# Patient Record
Sex: Male | Born: 1945
Health system: Southern US, Community
[De-identification: ages and names within clinical notes are randomized; demographics above are authoritative.]

## PROBLEM LIST (undated history)

## (undated) ENCOUNTER — Ambulatory Visit

## (undated) ENCOUNTER — Telehealth: Attending: Radiation Oncology | Primary: Radiation Oncology

## (undated) ENCOUNTER — Encounter: Attending: Radiation Oncology | Primary: Radiation Oncology

## (undated) ENCOUNTER — Ambulatory Visit: Payer: PRIVATE HEALTH INSURANCE

## (undated) ENCOUNTER — Telehealth

## (undated) ENCOUNTER — Encounter
Attending: Student in an Organized Health Care Education/Training Program | Primary: Student in an Organized Health Care Education/Training Program

## (undated) ENCOUNTER — Encounter

## (undated) ENCOUNTER — Ambulatory Visit: Attending: Radiation Oncology | Primary: Radiation Oncology

## (undated) ENCOUNTER — Encounter: Attending: Urology | Primary: Urology

## (undated) ENCOUNTER — Telehealth
Attending: Student in an Organized Health Care Education/Training Program | Primary: Student in an Organized Health Care Education/Training Program

## (undated) ENCOUNTER — Ambulatory Visit: Payer: PRIVATE HEALTH INSURANCE | Attending: Radiation Oncology | Primary: Radiation Oncology

## (undated) ENCOUNTER — Encounter: Attending: Oncology | Primary: Oncology

## (undated) DIAGNOSIS — G43909 Migraine, unspecified, not intractable, without status migrainosus: Secondary | ICD-10-CM

## (undated) DIAGNOSIS — M5127 Other intervertebral disc displacement, lumbosacral region: Secondary | ICD-10-CM

## (undated) DIAGNOSIS — G629 Polyneuropathy, unspecified: Secondary | ICD-10-CM

## (undated) DIAGNOSIS — K219 Gastro-esophageal reflux disease without esophagitis: Secondary | ICD-10-CM

## (undated) DIAGNOSIS — R002 Palpitations: Secondary | ICD-10-CM

## (undated) DIAGNOSIS — K2270 Barrett's esophagus without dysplasia: Secondary | ICD-10-CM

## (undated) DIAGNOSIS — K449 Diaphragmatic hernia without obstruction or gangrene: Secondary | ICD-10-CM

## (undated) DIAGNOSIS — I499 Cardiac arrhythmia, unspecified: Secondary | ICD-10-CM

## (undated) DIAGNOSIS — F419 Anxiety disorder, unspecified: Secondary | ICD-10-CM

## (undated) DIAGNOSIS — I8390 Asymptomatic varicose veins of unspecified lower extremity: Secondary | ICD-10-CM

## (undated) DIAGNOSIS — I10 Essential (primary) hypertension: Secondary | ICD-10-CM

## (undated) DIAGNOSIS — D126 Benign neoplasm of colon, unspecified: Secondary | ICD-10-CM

## (undated) HISTORY — DX: Gastro-esophageal reflux disease without esophagitis: K21.9

## (undated) HISTORY — PX: TONSILLECTOMY AND ADENOIDECTOMY: SUR1326

## (undated) HISTORY — DX: Migraine, unspecified, not intractable, without status migrainosus: G43.909

## (undated) HISTORY — PX: PROSTATE BIOPSY: SHX241

## (undated) HISTORY — DX: Palpitations: R00.2

---
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
## 2003-11-28 ENCOUNTER — Other Ambulatory Visit: Payer: Self-pay

## 2005-02-21 ENCOUNTER — Ambulatory Visit: Payer: Self-pay | Admitting: Unknown Physician Specialty

## 2005-03-06 ENCOUNTER — Ambulatory Visit: Payer: Self-pay | Admitting: Unknown Physician Specialty

## 2006-06-01 DIAGNOSIS — I10 Essential (primary) hypertension: Secondary | ICD-10-CM | POA: Insufficient documentation

## 2006-06-01 DIAGNOSIS — K449 Diaphragmatic hernia without obstruction or gangrene: Secondary | ICD-10-CM | POA: Insufficient documentation

## 2006-06-01 DIAGNOSIS — K2100 Gastro-esophageal reflux disease with esophagitis, without bleeding: Secondary | ICD-10-CM | POA: Insufficient documentation

## 2006-12-04 HISTORY — PX: RETINAL DETACHMENT SURGERY: SHX105

## 2007-08-20 ENCOUNTER — Ambulatory Visit: Payer: Self-pay | Admitting: Unknown Physician Specialty

## 2008-06-16 DIAGNOSIS — K2270 Barrett's esophagus without dysplasia: Secondary | ICD-10-CM | POA: Insufficient documentation

## 2009-01-06 ENCOUNTER — Encounter: Payer: Self-pay | Admitting: Cardiology

## 2009-01-07 ENCOUNTER — Ambulatory Visit: Payer: Self-pay | Admitting: Cardiology

## 2009-01-12 ENCOUNTER — Ambulatory Visit: Payer: Self-pay | Admitting: Cardiology

## 2009-01-14 ENCOUNTER — Encounter: Payer: Self-pay | Admitting: Cardiology

## 2009-01-14 ENCOUNTER — Ambulatory Visit: Payer: Self-pay

## 2009-01-21 ENCOUNTER — Ambulatory Visit: Payer: Self-pay | Admitting: Cardiology

## 2010-05-04 ENCOUNTER — Ambulatory Visit: Payer: Self-pay | Admitting: Unknown Physician Specialty

## 2010-10-26 ENCOUNTER — Ambulatory Visit: Payer: Self-pay | Admitting: Cardiovascular Disease

## 2010-10-26 DIAGNOSIS — R002 Palpitations: Secondary | ICD-10-CM

## 2010-10-26 DIAGNOSIS — I10 Essential (primary) hypertension: Secondary | ICD-10-CM

## 2010-10-28 ENCOUNTER — Encounter: Payer: Self-pay | Admitting: Cardiovascular Disease

## 2011-01-03 NOTE — Assessment & Plan Note (Signed)
Summary: EC6/AMD  Medications Added PRILOSEC 20 MG CPDR (OMEPRAZOLE) 1 tablet two times a day * FISH OIL 1000MG  1 tablet two times a day CALTRATE 600 1500 MG TABS (CALCIUM CARBONATE) 1 tabet once daily CITRATE OF MAGNESIA  SOLN (MAGNESIUM CITRATE) once daily      Allergies Added: ! ASPIRIN  Visit Type:  Initial Consult Primary Provider:  Dortha Kern, PA-C  CC:  establish care. Denies chest pain and SOB. c/o occasional palpitations..  History of Present Illness: 65 year old with a h/o  palpitations. Previous h/o HTN  He continues to have a lot of stress at work.   A Holter monitor in 2010 showed frequent premature ventricular contractions.  There were no other significant arrhythmias noted on the Holter monitor.    He was previously started on low dose lisinopril (currently cuts it into a 1/4) with SBP in the low 100s. He is not taking metoprolol for palpitations. He swims frequently with no symptoms.   echocardiogram done in February 2007.   LV systolic function was normal with an EF of 55%.  There was no LVH.  The RV appeared normal.  The septal wall showed abnormal motion  EKG: NSR with rate of 73 bpm, no significant ST or T wave changes  Current Medications (verified): 1)  Lisinopril 5 Mg Tabs (Lisinopril) .... Take One Tablet By Mouth Daily 2)  Prilosec 20 Mg Cpdr (Omeprazole) .Marland Kitchen.. 1 Tablet Two Times A Day 3)  Fish Oil 1000mg  .... 1 Tablet Two Times A Day 4)  Caltrate 600 1500 Mg Tabs (Calcium Carbonate) .Marland Kitchen.. 1 Tabet Once Daily 5)  Citrate of Magnesia  Soln (Magnesium Citrate) .... Once Daily  Allergies (verified): 1)  ! Aspirin  Past History:  Past Medical History: Last updated: 11/08/2009 1. Gastroesophageal reflux disease 2. History of palpitations identified as PVC's. We did do a holter monitor in February 2010 showing frequent PVC's. Additionally, the patient had an echocardiogram done in February 2010. EF 55%. There was some abnormal septal motion that  may have been due to frequent PVC's. There was no LVH. There was no significant valvular disorder and the RV was normal in size and function.  Family History: Last updated: 11/08/2009 There is no family history of premature coronary disease  Social History: Last updated: 10/26/2010 Married Lives in [place] with his wife Works for [employer]  smoke-no Alcohol Use - no  Social History: Married Lives in [place] with his wife Works for [employer]  smoke-no Alcohol Use - no  Review of Systems  The patient denies fever, weight loss, weight gain, vision loss, decreased hearing, hoarseness, chest pain, syncope, dyspnea on exertion, peripheral edema, prolonged cough, abdominal pain, incontinence, muscle weakness, depression, and enlarged lymph nodes.         rare palpitations  Vital Signs:  Patient profile:   65 year old male Height:      71 inches Weight:      173.75 pounds BMI:     24.32 Pulse rate:   73 / minute BP sitting:   162 / 92  (left arm) Cuff size:   regular  Vitals Entered By: Bishop Dublin, CMA (October 26, 2010 2:28 PM)  Physical Exam  General:  Well developed, well nourished, in no acute distress. Head:  normocephalic and atraumatic Neck:  Neck supple, no JVD. No masses, thyromegaly or abnormal cervical nodes. Lungs:  Clear bilaterally to auscultation and percussion. Heart:  Non-displaced PMI, chest non-tender; regular rate and rhythm, S1, S2 without murmurs,  rubs or gallops. Carotid upstroke normal, no bruit.  Pedals normal pulses. No edema, no varicosities. Abdomen:  Bowel sounds positive; abdomen soft and non-tender without masses Msk:  Back normal, normal gait. Muscle strength and tone normal. Pulses:  pulses normal in all 4 extremities Extremities:  No clubbing or cyanosis. Neurologic:  Alert and oriented x 3. Skin:  Intact without lesions or rashes. Psych:  Normal affect.   Impression & Recommendations:  Problem # 1:   PALPITATIONS (ICD-785.1) rare palpitations. He did not seem to be particularly bothered by them. Is not taking beta blockers. I suggested to him that he can take them p.r.n. for significant palpitations.  His updated medication list for this problem includes:    Lisinopril 5 Mg Tabs (Lisinopril) .Marland Kitchen... Take one tablet by mouth daily  Problem # 2:  HYPERTENSION, BENIGN (ICD-401.1) Blood pressure today he appears to be well-controlled. Have asked him to hold his lisinopril one quarter tab and to monitor his blood pressure at home.  His updated medication list for this problem includes:    Lisinopril 5 Mg Tabs (Lisinopril) .Marland Kitchen... Take one tablet by mouth daily  Problem # 3:  PREVENTIVE HEALTH CARE (ICD-V70.0) We talked with him about screening for coronary artery disease. We did suggest that he could have a coronary calcium score at [facility] hospital.  Patient Instructions: 1)  Your physician recommends that you schedule a follow-up appointment in: 1 year 2)  Your physician recommends that you continue on your current medications as directed. Please refer to the Current Medication list given to you today.

## 2011-04-18 NOTE — Assessment & Plan Note (Signed)
[facility] OFFICE NOTE   Mike Spence, Mike Spence                        MRN:          [id number]  DATE:01/21/2009                            DOB:          1945/12/12    PRIMARY CARE PHYSICIAN:  Dr. Julieanne Manson, at [facility].   The patient's PA is Dortha Kern at [facility].   HISTORY OF PRESENT ILLNESS:  This is a 65 year old with no real  significant past medical history who presented to cardiology clinic  initially earlier this month for evaluation of palpitations.  The  patient had been under a lot of stress at work and he had noted the  onset of very frequent palpitations.  We did do a Holter monitor which  showed frequent premature ventricular contractions.  There were no other  significant arrhythmias noted on the Holter monitor.  Since I last saw,  the patient states that he has been exercising a lot more and he has  started taking metoprolol 12.5 mg twice a day.  With these 2 steps, he  says that his palpitations have completely resolved and he has not been  feeling anymore symptoms at all.  He does of note have quite excellent  exercise tolerance.  He does aerobic exercise almost everyday of the  week, swimming and occasionally jogging.  He does all this without any  significant shortness of breath on exertion or chest pain with exertion.  The patient also had an echocardiogram done in February 2007.  LV  systolic function was normal with an EF of 55%.  There was no LVH.  The  RV appeared normal.  The septal wall showed abnormal motion and I do  wonder if this was due to frequent PVCs during the echocardiogram.  Also  of note, the patient's blood pressure has been elevated the last 2 times  he has been here.  Today, it is 166/60.  The last time I saw him, it was  150/90.  The patient does tell me that his blood pressure always runs  high when he comes to the doctor's  office and tends to run lower at  home, although he says occasionally when he checks it at home it has  also been running high.  This has started to worry him a bit.   PAST MEDICAL HISTORY:  1. Gastroesophageal reflux disease.  2. History of palpitations identified as PVCs.  We did do a Holter      monitor in February 2010 showing frequent PVCs.  Additionally, the      patient had an echocardiogram done in February 2010.  EF 55%.      There was some abnormal septal motion that may have been due to      frequent PVCs.  There was no LVH.  There was no significant      valvular disorder, and the RV was normal in size and function.   SOCIAL HISTORY:  The patient lives with his wife in [place].  He  works for a Chiropractor.  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  FAMILY HISTORY:  There is no family history of premature coronary  disease.   MEDICATIONS:  1. Aciphex 20 mg q.a.m.  2. Prilosec 20 mg q.p.m.  3. Lopressor 12.5 mg twice a day.   Most recent labs from September 2009, LDL 109, HDL 71.   PHYSICAL EXAMINATION:  VITAL SIGNS:  Blood pressure is 166/60, heart  rate is 94 and regular.  GENERAL:  This is a well-developed male in no apparent stress.  NEUROLOGIC:  Alert and oriented x3.  Normal affect.  NECK:  There is no JVD.  There is no thyromegaly or thyroid nodule.  CARDIOVASCULAR:  Heart regular S1 and S2.  No S3.  There is an S4 noted.  There are no premature beats.  There is no murmur.  No peripheral edema.  No carotid bruit.  ABDOMEN:  Soft, nontender.  No hepatosplenomegaly.  Normal bowel sounds.   ASSESSMENT AND PLAN:  This is a 65 year old who presented with  palpitations and was found to have frequent premature ventricular  contractions.  1. Palpitations.  I think the patient's palpitations were due to      premature ventricular contractions.  With increased exercise and      with the use of a low dose of a beta-blocker, his premature      ventricular contractions appeared to  have completely resolved.  He      is no longer feeling any symptoms, and I do not hear any premature      ventricular contractions on exam.  His Holter monitor did not show      any runs of ventricular tachycardia or atrial fibrillation.  He did      have an echocardiogram that showed normal LV systolic function.      There was abnormal septal motion, but I think that may have been      due to the frequent premature ventricular contractions during the      study.  I do think it would be reasonable for him to continue      taking the Lopressor.  2. Hypertension.  The patient's blood pressure is high again today at      166/60, when he checks it at home often it is normal but sometimes      it is high.  I do hear an S4 on exam.  Therefore, I do think it is      reasonable to start him on a blood pressure medicine.  Metoprolol      at that low a dose is not a particularly effective blood pressure      medicine.  I am going to start him on      triamterene/hydrochlorothiazide 37.5/25 daily.  We will have him      back in 2 weeks to check his blood pressure and a Chem-7 to make      sure that his potassium does not get low as this could predispose      him to more premature ventricular contractions.  3. Coronary artery disease risk.  The patient is actually fairly low      risk from the standpoint of coronary disease.  He has very      excellent exercise tolerance.  His main risk is age and      hypertension.  His HDL level is excellent.  His LDL level is at the      goal given his risk level.  I did ask him to start aspirin 81 mg a  day and he will also start taking fish oil 2 g a day.  There was      abnormal septal motion on his echo; however, I do not think it is      necessary to do a stress test at this time given his quite      excellent exercise tolerance with no symptoms and also the fact      this may have been due to premature ventricular contractions during      the exam.   4. I will see the patient back in the office in 1 year's time to see      how he is doing unless he needs to be seen sooner.     Marca Ancona, MD  Electronically Signed    DM/MedQ  DD: 01/21/2009  DT: 01/22/2009  Job #: [id number]   cc:   Lloyd Huger, PA

## 2011-04-18 NOTE — Assessment & Plan Note (Signed)
[facility] OFFICE NOTE   Spence, Mike                        MRN:          [id number]  DATE:01/07/2009                            DOB:          1945/12/29    PRIMARY CARE PHYSICIAN:  Julieanne Manson at [facility]  and the patient's PA is Aldine Contes at [facility].   HISTORY OF PRESENT ILLNESS:  This is a 65 year old with no real  significant past medical history who presents to Cardiology Clinic for  evaluation of palpitations.  The patient states that for the past 3 days  he has felt like his heart has been skipping beats.  He feels 2-3  skipped beats every minute.  He has not felt any episodes of tachycardia  and he has not been lightheaded.  He denies any episodes of syncope ever  in the past.  The patient does bring an EKG today done recently in his  primary care physician's office which shows normal sinus rhythm with  frequent PVCs.  The patient does tell me that several years ago he also  had episodes of similar palpitations, but not this severe.  He was told  at that time that he had benign PVCs.  The patient states that really  prior to 3 days ago he was not having any episodes of palpitations and  he says that the only thing different recently has been that he has been  under a lot more stress at work over the last few days.  He does not  drink any caffeine.  The last time he had palpitations, he had been  drinking lot of caffeine and he cut that out and has not been drinking  caffeine since.  He has not been using any over-the-counter cold  medications.  He is quite active.  He uses a Forensic scientist for 45 minutes  3-4 times a week.  He swims 2-3 times a week, occasionally jogs.  He is  not having any shortness of breath with exertion, does not have any  chest pain with exertion.  He does have excellent exercise tolerance.  He has no family history of coronary  disease or arrhythmias.   PAST MEDICAL HISTORY:  1. Gastroesophageal reflux disease.  2. History of palpitations identified as PVCs several years ago      associated with caffeine use.   SOCIAL HISTORY:  The patient lives with his wife here in [place].  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   FAMILY HISTORY:  There is no family history of premature coronary artery  disease.   MEDICATIONS:  1. Aciphex 20 mg daily.  2. Xanax 0.5 mg p.r.n.   EKG was reviewed today shows normal sinus rhythm with frequent PVCs.  Labs from February 2010 shows potassium greater than for creatinine  1.24, glucose 107, TSH normal.   REVIEW OF SYSTEMS:  Review of systems is negative except as per the  history of present illness.   PHYSICAL EXAMINATION:  VITAL SIGNS:  Blood pressure is  150/90.  Currently it was 120/80.  He says when he was at home, heart rate is 88  and with frequent premature beats.  GENERAL:  This is a well-developed male in no apparent distress.  HEENT:  Normal exam.  NEUROLOGIC:  Alert and oriented x3.  The patient does have an anxious  affect.  NECK:  There is no JVD.  There is no thyromegaly or thyroid nodule.  Carotid upstrokes are normal.  CARDIOVASCULAR:  Heart regular S1, S2.  No S3.  There is an S4 noted.  There are occasional premature beats.  There is no murmur.  There is no  peripheral edema.  There are 2+ posterior tibial pulses bilaterally.  There is no current carotid bruit.  ABDOMEN:  Soft, nontender.  No hepatosplenomegaly.  Normal bowel sounds.  EXTREMITIES:  No clubbing or cyanosis.  SKIN:  Normal exam.  MUSCULOSKELETAL:  Normal exam.   ASSESSMENT/PLAN:  This is a 65 year old who presents with palpitations  and was noted to have frequent PVCs on exam and on EKG.  1. Palpitations.  I think the patient's palpitations are most likely      benign premature ventricular contractions.  He has had them before,      they were  related to heavy caffeine use in the past.  He is not      drinking caffeine anymore.  He does state that he has been under      more stress at work and likely he has sympathetic upregulation from      this, maybe is triggering his PVCs.  They are quite worrisome and      bothersome to him.  He has not have any symptoms that would be      consistent with ischemia or  congestive heart failure.  I do think      given the frequency of the PVCs that it will be reasonable to      obtain an echocardiogram to ensure that he has a structurally      normal heart.  Additionally, we will do a 24-hour Holter monitor to      make sure that he is not having any malignant arrhythmias, which I      doubt as he has felt no symptoms other than occasional pauses      consistent with PVCs.  He did have TSH done in his primary care      physician's office which was normal.  His potassium level also was      normal.  Finally, I did tell him that I will give him a      prescription for Toprol-XL 25 mg daily.  This may help to suppress      the sympathetic drive that is likely triggering his PVCs.  I told      him that if he feels like he needs it, he can go ahead and take      this.  2. Hypertension.  The patient's blood pressure is 150/90 today and he      says that it had been running 120/80 at home every time he checks      it.  He says every time he goes to the doctor's office, it runs      high.  I do wonder if it is running high at times at home as well      as I can hear an S4 on exam.  Therefore, I think taking the beta-  blocker would be reasonable also for the control of his blood      pressure.  3. We will see the patient back in 2 weeks at our office after all his      studies are done and we will go over the results.     Marca Ancona, MD  Electronically Signed    DM/MedQ  DD: 01/07/2009  DT: 01/08/2009  Job #: [id number]   cc:   Julieanne Manson

## 2011-05-18 ENCOUNTER — Encounter: Payer: Self-pay | Admitting: Cardiology

## 2011-12-05 HISTORY — PX: VARICOSE VEIN SURGERY: SHX832

## 2012-02-01 DIAGNOSIS — L578 Other skin changes due to chronic exposure to nonionizing radiation: Secondary | ICD-10-CM | POA: Diagnosis not present

## 2012-02-01 DIAGNOSIS — L570 Actinic keratosis: Secondary | ICD-10-CM | POA: Diagnosis not present

## 2012-02-01 DIAGNOSIS — L820 Inflamed seborrheic keratosis: Secondary | ICD-10-CM | POA: Diagnosis not present

## 2012-02-15 DIAGNOSIS — Z125 Encounter for screening for malignant neoplasm of prostate: Secondary | ICD-10-CM | POA: Diagnosis not present

## 2012-02-15 DIAGNOSIS — F411 Generalized anxiety disorder: Secondary | ICD-10-CM | POA: Diagnosis not present

## 2012-02-15 DIAGNOSIS — Z23 Encounter for immunization: Secondary | ICD-10-CM | POA: Diagnosis not present

## 2012-02-15 DIAGNOSIS — J069 Acute upper respiratory infection, unspecified: Secondary | ICD-10-CM | POA: Diagnosis not present

## 2012-02-15 DIAGNOSIS — K449 Diaphragmatic hernia without obstruction or gangrene: Secondary | ICD-10-CM | POA: Diagnosis not present

## 2012-07-15 DIAGNOSIS — H40009 Preglaucoma, unspecified, unspecified eye: Secondary | ICD-10-CM | POA: Diagnosis not present

## 2012-09-05 DIAGNOSIS — N419 Inflammatory disease of prostate, unspecified: Secondary | ICD-10-CM | POA: Diagnosis not present

## 2012-09-05 DIAGNOSIS — F411 Generalized anxiety disorder: Secondary | ICD-10-CM | POA: Diagnosis not present

## 2012-09-05 DIAGNOSIS — K449 Diaphragmatic hernia without obstruction or gangrene: Secondary | ICD-10-CM | POA: Diagnosis not present

## 2012-09-05 DIAGNOSIS — Z125 Encounter for screening for malignant neoplasm of prostate: Secondary | ICD-10-CM | POA: Diagnosis not present

## 2012-11-08 DIAGNOSIS — K449 Diaphragmatic hernia without obstruction or gangrene: Secondary | ICD-10-CM | POA: Diagnosis not present

## 2012-11-08 DIAGNOSIS — J069 Acute upper respiratory infection, unspecified: Secondary | ICD-10-CM | POA: Diagnosis not present

## 2012-11-08 DIAGNOSIS — F411 Generalized anxiety disorder: Secondary | ICD-10-CM | POA: Diagnosis not present

## 2012-11-08 DIAGNOSIS — Z125 Encounter for screening for malignant neoplasm of prostate: Secondary | ICD-10-CM | POA: Diagnosis not present

## 2013-02-13 DIAGNOSIS — R002 Palpitations: Secondary | ICD-10-CM | POA: Diagnosis not present

## 2013-02-13 DIAGNOSIS — F411 Generalized anxiety disorder: Secondary | ICD-10-CM | POA: Diagnosis not present

## 2013-02-13 DIAGNOSIS — K449 Diaphragmatic hernia without obstruction or gangrene: Secondary | ICD-10-CM | POA: Diagnosis not present

## 2013-02-13 DIAGNOSIS — Z125 Encounter for screening for malignant neoplasm of prostate: Secondary | ICD-10-CM | POA: Diagnosis not present

## 2013-04-23 DIAGNOSIS — H40009 Preglaucoma, unspecified, unspecified eye: Secondary | ICD-10-CM | POA: Diagnosis not present

## 2013-04-25 DIAGNOSIS — Z125 Encounter for screening for malignant neoplasm of prostate: Secondary | ICD-10-CM | POA: Diagnosis not present

## 2013-04-25 DIAGNOSIS — F411 Generalized anxiety disorder: Secondary | ICD-10-CM | POA: Diagnosis not present

## 2013-04-25 DIAGNOSIS — Z0000 Encounter for general adult medical examination without abnormal findings: Secondary | ICD-10-CM | POA: Diagnosis not present

## 2013-04-25 DIAGNOSIS — K449 Diaphragmatic hernia without obstruction or gangrene: Secondary | ICD-10-CM | POA: Diagnosis not present

## 2013-04-30 DIAGNOSIS — Z125 Encounter for screening for malignant neoplasm of prostate: Secondary | ICD-10-CM | POA: Diagnosis not present

## 2013-04-30 DIAGNOSIS — Z136 Encounter for screening for cardiovascular disorders: Secondary | ICD-10-CM | POA: Diagnosis not present

## 2013-04-30 DIAGNOSIS — I10 Essential (primary) hypertension: Secondary | ICD-10-CM | POA: Diagnosis not present

## 2013-06-24 DIAGNOSIS — Z86010 Personal history of colonic polyps: Secondary | ICD-10-CM | POA: Diagnosis not present

## 2013-06-24 DIAGNOSIS — Z09 Encounter for follow-up examination after completed treatment for conditions other than malignant neoplasm: Secondary | ICD-10-CM | POA: Diagnosis not present

## 2013-06-24 DIAGNOSIS — D131 Benign neoplasm of stomach: Secondary | ICD-10-CM | POA: Diagnosis not present

## 2013-06-24 DIAGNOSIS — D126 Benign neoplasm of colon, unspecified: Secondary | ICD-10-CM | POA: Diagnosis not present

## 2013-06-24 DIAGNOSIS — K621 Rectal polyp: Secondary | ICD-10-CM | POA: Diagnosis not present

## 2013-06-24 DIAGNOSIS — K620 Anal polyp: Secondary | ICD-10-CM | POA: Diagnosis not present

## 2013-06-24 DIAGNOSIS — K2270 Barrett's esophagus without dysplasia: Secondary | ICD-10-CM | POA: Diagnosis not present

## 2013-06-24 DIAGNOSIS — K648 Other hemorrhoids: Secondary | ICD-10-CM | POA: Diagnosis not present

## 2014-05-07 DIAGNOSIS — Z125 Encounter for screening for malignant neoplasm of prostate: Secondary | ICD-10-CM | POA: Diagnosis not present

## 2014-05-07 DIAGNOSIS — Z1339 Encounter for screening examination for other mental health and behavioral disorders: Secondary | ICD-10-CM | POA: Diagnosis not present

## 2014-05-07 DIAGNOSIS — Z0000 Encounter for general adult medical examination without abnormal findings: Secondary | ICD-10-CM | POA: Diagnosis not present

## 2014-05-07 DIAGNOSIS — K449 Diaphragmatic hernia without obstruction or gangrene: Secondary | ICD-10-CM | POA: Diagnosis not present

## 2014-05-07 DIAGNOSIS — Z1331 Encounter for screening for depression: Secondary | ICD-10-CM | POA: Diagnosis not present

## 2014-05-07 DIAGNOSIS — Z23 Encounter for immunization: Secondary | ICD-10-CM | POA: Diagnosis not present

## 2014-05-07 DIAGNOSIS — F411 Generalized anxiety disorder: Secondary | ICD-10-CM | POA: Diagnosis not present

## 2014-05-13 DIAGNOSIS — K2270 Barrett's esophagus without dysplasia: Secondary | ICD-10-CM | POA: Diagnosis not present

## 2014-05-13 DIAGNOSIS — Z125 Encounter for screening for malignant neoplasm of prostate: Secondary | ICD-10-CM | POA: Diagnosis not present

## 2014-05-13 DIAGNOSIS — I10 Essential (primary) hypertension: Secondary | ICD-10-CM | POA: Diagnosis not present

## 2014-05-13 LAB — HEPATIC FUNCTION PANEL
ALT: 14 U/L (ref 10–40)
AST: 15 U/L (ref 14–40)
Alkaline Phosphatase: 47 U/L (ref 25–125)
BILIRUBIN, TOTAL: 0.5 mg/dL

## 2014-05-13 LAB — CBC AND DIFFERENTIAL
HCT: 39 % — AB (ref 41–53)
Hemoglobin: 13.1 g/dL — AB (ref 13.5–17.5)
NEUTROS ABS: 3 /uL
PLATELETS: 215 10*3/uL (ref 150–399)
WBC: 5.6 10^3/mL

## 2014-05-13 LAB — LIPID PANEL
Cholesterol: 205 mg/dL — AB (ref 0–200)
HDL: 73 mg/dL — AB (ref 35–70)
LDL CALC: 123 mg/dL
TRIGLYCERIDES: 47 mg/dL (ref 40–160)

## 2014-05-13 LAB — PSA: PSA: 2.5

## 2014-05-13 LAB — BASIC METABOLIC PANEL
BUN: 22 mg/dL — AB (ref 4–21)
Creatinine: 1.2 mg/dL (ref 0.6–1.3)
GLUCOSE: 98 mg/dL
Potassium: 4.4 mmol/L (ref 3.4–5.3)
SODIUM: 142 mmol/L (ref 137–147)

## 2014-06-05 ENCOUNTER — Emergency Department: Payer: Self-pay | Admitting: Emergency Medicine

## 2014-06-05 DIAGNOSIS — S81009A Unspecified open wound, unspecified knee, initial encounter: Secondary | ICD-10-CM | POA: Diagnosis not present

## 2014-06-05 DIAGNOSIS — S81809A Unspecified open wound, unspecified lower leg, initial encounter: Secondary | ICD-10-CM | POA: Diagnosis not present

## 2014-06-16 DIAGNOSIS — Z4802 Encounter for removal of sutures: Secondary | ICD-10-CM | POA: Diagnosis not present

## 2014-06-16 DIAGNOSIS — S81809A Unspecified open wound, unspecified lower leg, initial encounter: Secondary | ICD-10-CM | POA: Diagnosis not present

## 2014-12-09 DIAGNOSIS — H02053 Trichiasis without entropian right eye, unspecified eyelid: Secondary | ICD-10-CM | POA: Diagnosis not present

## 2014-12-14 DIAGNOSIS — H40003 Preglaucoma, unspecified, bilateral: Secondary | ICD-10-CM | POA: Diagnosis not present

## 2015-01-06 DIAGNOSIS — K2270 Barrett's esophagus without dysplasia: Secondary | ICD-10-CM | POA: Diagnosis not present

## 2015-01-06 DIAGNOSIS — K219 Gastro-esophageal reflux disease without esophagitis: Secondary | ICD-10-CM | POA: Diagnosis not present

## 2015-02-25 DIAGNOSIS — M2391 Unspecified internal derangement of right knee: Secondary | ICD-10-CM | POA: Diagnosis not present

## 2015-02-25 DIAGNOSIS — M25561 Pain in right knee: Secondary | ICD-10-CM | POA: Diagnosis not present

## 2015-07-26 DIAGNOSIS — D1800 Hemangioma unspecified site: Secondary | ICD-10-CM | POA: Diagnosis not present

## 2015-07-26 DIAGNOSIS — L820 Inflamed seborrheic keratosis: Secondary | ICD-10-CM | POA: Diagnosis not present

## 2015-07-26 DIAGNOSIS — D485 Neoplasm of uncertain behavior of skin: Secondary | ICD-10-CM | POA: Diagnosis not present

## 2015-07-26 DIAGNOSIS — L814 Other melanin hyperpigmentation: Secondary | ICD-10-CM | POA: Diagnosis not present

## 2015-07-26 DIAGNOSIS — Z1283 Encounter for screening for malignant neoplasm of skin: Secondary | ICD-10-CM | POA: Diagnosis not present

## 2015-07-26 DIAGNOSIS — D229 Melanocytic nevi, unspecified: Secondary | ICD-10-CM | POA: Diagnosis not present

## 2015-07-26 DIAGNOSIS — I8393 Asymptomatic varicose veins of bilateral lower extremities: Secondary | ICD-10-CM | POA: Diagnosis not present

## 2015-07-26 DIAGNOSIS — L570 Actinic keratosis: Secondary | ICD-10-CM | POA: Diagnosis not present

## 2015-07-26 DIAGNOSIS — Z85828 Personal history of other malignant neoplasm of skin: Secondary | ICD-10-CM | POA: Diagnosis not present

## 2015-07-26 DIAGNOSIS — D2272 Melanocytic nevi of left lower limb, including hip: Secondary | ICD-10-CM | POA: Diagnosis not present

## 2015-07-26 DIAGNOSIS — L720 Epidermal cyst: Secondary | ICD-10-CM | POA: Diagnosis not present

## 2015-07-26 DIAGNOSIS — L578 Other skin changes due to chronic exposure to nonionizing radiation: Secondary | ICD-10-CM | POA: Diagnosis not present

## 2015-07-26 DIAGNOSIS — L821 Other seborrheic keratosis: Secondary | ICD-10-CM | POA: Diagnosis not present

## 2015-08-16 DIAGNOSIS — M7989 Other specified soft tissue disorders: Secondary | ICD-10-CM | POA: Diagnosis not present

## 2015-08-16 DIAGNOSIS — M79609 Pain in unspecified limb: Secondary | ICD-10-CM | POA: Diagnosis not present

## 2015-08-16 DIAGNOSIS — I872 Venous insufficiency (chronic) (peripheral): Secondary | ICD-10-CM | POA: Diagnosis not present

## 2015-08-16 DIAGNOSIS — I8310 Varicose veins of unspecified lower extremity with inflammation: Secondary | ICD-10-CM | POA: Diagnosis not present

## 2015-09-15 DIAGNOSIS — Z87891 Personal history of nicotine dependence: Secondary | ICD-10-CM | POA: Insufficient documentation

## 2015-09-15 DIAGNOSIS — F411 Generalized anxiety disorder: Secondary | ICD-10-CM | POA: Insufficient documentation

## 2015-09-15 DIAGNOSIS — H469 Unspecified optic neuritis: Secondary | ICD-10-CM | POA: Insufficient documentation

## 2015-09-16 DIAGNOSIS — I872 Venous insufficiency (chronic) (peripheral): Secondary | ICD-10-CM | POA: Diagnosis not present

## 2015-09-16 DIAGNOSIS — M79609 Pain in unspecified limb: Secondary | ICD-10-CM | POA: Diagnosis not present

## 2015-09-16 DIAGNOSIS — M7989 Other specified soft tissue disorders: Secondary | ICD-10-CM | POA: Diagnosis not present

## 2015-09-16 DIAGNOSIS — I8310 Varicose veins of unspecified lower extremity with inflammation: Secondary | ICD-10-CM | POA: Diagnosis not present

## 2015-09-20 ENCOUNTER — Other Ambulatory Visit: Payer: Self-pay

## 2015-09-20 ENCOUNTER — Ambulatory Visit (INDEPENDENT_AMBULATORY_CARE_PROVIDER_SITE_OTHER): Payer: Medicare Other | Admitting: Family Medicine

## 2015-09-20 ENCOUNTER — Encounter: Payer: Self-pay | Admitting: Family Medicine

## 2015-09-20 VITALS — BP 170/90 | HR 104 | Temp 97.7°F | Resp 16 | Ht 70.25 in | Wt 177.0 lb

## 2015-09-20 DIAGNOSIS — Z23 Encounter for immunization: Secondary | ICD-10-CM

## 2015-09-20 DIAGNOSIS — I8390 Asymptomatic varicose veins of unspecified lower extremity: Secondary | ICD-10-CM

## 2015-09-20 DIAGNOSIS — K2270 Barrett's esophagus without dysplasia: Secondary | ICD-10-CM | POA: Diagnosis not present

## 2015-09-20 DIAGNOSIS — Z0000 Encounter for general adult medical examination without abnormal findings: Secondary | ICD-10-CM | POA: Diagnosis not present

## 2015-09-20 DIAGNOSIS — K219 Gastro-esophageal reflux disease without esophagitis: Secondary | ICD-10-CM | POA: Insufficient documentation

## 2015-09-20 DIAGNOSIS — I8393 Asymptomatic varicose veins of bilateral lower extremities: Secondary | ICD-10-CM | POA: Diagnosis not present

## 2015-09-20 DIAGNOSIS — I10 Essential (primary) hypertension: Secondary | ICD-10-CM

## 2015-09-20 LAB — POCT URINALYSIS DIPSTICK
BILIRUBIN UA: NEGATIVE
GLUCOSE UA: NEGATIVE
KETONES UA: NEGATIVE
Leukocytes, UA: NEGATIVE
Nitrite, UA: NEGATIVE
Protein, UA: NEGATIVE
SPEC GRAV UA: 1.01
Urobilinogen, UA: 0.2
pH, UA: 6

## 2015-09-20 NOTE — Progress Notes (Signed)
Patient ID: LEWIS KEATS, male   DOB: 1946/04/12, 69 y.o.   MRN: [id number] Patient: DEHAVEN SINE, Male    DOB: January 13, 1946, 69 y.o.   MRN: [id number] Visit Date: 09/20/2015  Today's Provider: [name], PA   Chief Complaint  Patient presents with  . Annual Exam   Subjective:  DARWYN PONZO is a 69 y.o. male who presents today for health maintenance and complete physical. He feels fairly well. He reports exercising daily (yoga, swimming and walking). He reports he is sleeping well (average 8 hours a night).  Review of Systems  Constitutional: Negative.   HENT: Negative.   Eyes: Negative.   Respiratory: Negative.   Cardiovascular: Negative.   Gastrointestinal: Negative.   Genitourinary: Positive for urgency.  Musculoskeletal: Negative.   Skin: Negative.   Allergic/Immunologic: Positive for food allergies.  Neurological: Negative.   Hematological: Negative.   Psychiatric/Behavioral: Negative.     Social History   Social History  . Marital Status: Married    Spouse Name: N/A  . Number of Children: N/A  . Years of Education: N/A   Occupational History  . Not on file.   Social History Main Topics  . Smoking status: Former Research scientist (life sciences)  . Smokeless tobacco: Never Used     Comment: smoke-no  . Alcohol Use: No  . Drug Use: No  . Sexual Activity: Not on file   Other Topics Concern  . Not on file   Social History Narrative   Married and lives in [place] with wife. Worked for [employer] for 30 years. Retired  May 04, 2015.     Patient Active Problem List   Diagnosis Date Noted  . Phlebectasia 09/20/2015  . Acid reflux 09/20/2015  . Anxiety, generalized 09/15/2015  . Personal history of tobacco use, presenting hazards to health 09/15/2015  . Anterior optic neuritis 09/15/2015  . HYPERTENSION, BENIGN 10/26/2010  . PALPITATIONS 10/26/2010  . Barrett esophagus 06/16/2008  . Leg varices 06/04/2007  . Diaphragmatic hernia 06/01/2006  . Esophagitis, reflux  06/01/2006  . Essential (primary) hypertension 06/01/2006    Past Surgical History  Procedure Laterality Date  . Tonsillectomy and adenoidectomy  1950's  . Retinal detachment surgery  2008  . Varicose vein surgery  2013   His family history includes Anxiety disorder in his daughter; CVA in his paternal grandfather; Colon cancer in his mother, paternal grandmother, and sister; Dementia in his father and mother; Diabetes in his father; Heart attack in his maternal grandfather; Hypertension in his father and mother; Kidney disease in his father; Mitral valve prolapse in his daughter; Ulcerative colitis in his son. There is no history of Coronary artery disease.    Allergies  Allergen Reactions  . Aspirin     REACTION: upsets stomach  . Gluten Meal Other (See Comments)   Outpatient Prescriptions Prior to Visit  Medication Sig Dispense Refill  . Cholecalciferol (VITAMIN D) 2000 UNITS CAPS Take by mouth.    Marland Kitchen lisinopril (PRINIVIL,ZESTRIL) 5 MG tablet Take 5 mg by mouth daily.      . magnesium citrate SOLN Take 296 mLs by mouth once.      Marland Kitchen omeprazole (PRILOSEC) 20 MG capsule Take 20 mg by mouth 2 (two) times daily.      . vitamin B-12 (CYANOCOBALAMIN) 250 MCG tablet Take by mouth.    . vitamin E 400 UNIT capsule Take by mouth.    . Calcium Carbonate (CALTRATE 600) 1500 MG TABS Take 1 tablet by mouth daily.      Marland Kitchen  Omega-3 Fatty Acids (FISH OIL) 1000 MG CAPS Take 1 capsule by mouth 2 (two) times daily.       No facility-administered medications prior to visit.    Patient Care Team: Margo Common, PA as PCP - General (Physician Assistant)     Objective:   Vitals:  Filed Vitals:   09/20/15 1015  BP: 170/90  Pulse: 104  Temp: 97.7 F (36.5 C)  TempSrc: Oral  Resp: 16  Height: 5' 10.25" (1.784 m)  Weight: 177 lb (80.287 kg)  SpO2: 97%   Wt Readings from Last 3 Encounters:  09/20/15 177 lb (80.287 kg)  05/07/14 168 lb (76.204 kg)  10/26/10 173 lb 12 oz (78.812 kg)     Physical Exam  Constitutional: He is oriented to person, place, and time. He appears well-developed and well-nourished.  HENT:  Head: Normocephalic and atraumatic.  Right Ear: External ear normal.  Left Ear: External ear normal.  Nose: Nose normal.  Mouth/Throat: Oropharynx is clear and moist.  Eyes: Conjunctivae and EOM are normal. Pupils are equal, round, and reactive to light. Right eye exhibits no discharge.  Neck: Normal range of motion. Neck supple. No tracheal deviation present. No thyromegaly present.  Cardiovascular: Normal rate, regular rhythm, normal heart sounds and intact distal pulses.   No murmur heard. Pulmonary/Chest: Effort normal and breath sounds normal. No respiratory distress. He has no wheezes. He has no rales. He exhibits no tenderness.  Abdominal: Soft. He exhibits no distension and no mass. There is no tenderness. There is no rebound and no guarding.  Musculoskeletal: Normal range of motion. He exhibits no edema or tenderness.  Lymphadenopathy:    He has no cervical adenopathy.  Neurological: He is alert and oriented to person, place, and time. He has normal reflexes. No cranial nerve deficit. He exhibits normal muscle tone. Coordination normal.  Skin: Skin is warm and dry. No rash noted. No erythema.  Psychiatric: He has a normal mood and affect. His behavior is normal. Judgment and thought content normal.   Depression Screen PHQ 2/9 Scores 09/20/2015  PHQ - 2 Score 0   Assessment & Plan:     Routine Health Maintenance and Physical Exam  Exercise Activities and Dietary recommendations Goals    Continue regular balanced diet and swimming routinely for exercise.      Immunization History  Administered Date(s) Administered  . Pneumococcal Conjugate-13 05/07/2014  . Tdap 07/04/2011    Health Maintenance  Topic Date Due  . Hepatitis C Screening  [birth year]  . ZOSTAVAX  09/07/2004  . PNA vac Low Risk Adult (2 of 2 - PPSV23) 05/08/2015  .  INFLUENZA VACCINE  07/05/2015  . TETANUS/TDAP  07/03/2021      Discussed health benefits of physical activity, and encouraged him to engage in regular exercise appropriate for his age and condition.    ------------------------------------------------------------------------------------------------------------  1. Annual physical exam Good general health with anxiety creating white-coat syndrome effect on BP. Will up date immunizations. Last colonoscopy 05-04-10 without dysplastic polyps. Alcohol screening - non-drinker, functional status - normal and fall screening - negative. Recheck annually. - POCT urinalysis dipstick  2. HYPERTENSION, BENIGN Always seems to have elevation of BP in the office. Better at home. Last EKG 2014 was NSR. Still using partial tablets of Lisinopril 5 mg intermittently. Will get routine labs and encouraged to continue checking BP at home to monitor. Recheck pending lab reports - CBC with Differential/Platelet - COMPLETE METABOLIC PANEL WITH GFR - Lipid panel - TSH  3. Barrett esophagus Documented hiatal hernia, gastric polyps, gastritis with Barrett's Esophagus on upper endoscopy in 2014. Continue Omeprazole 20 mg BID as recommended by gastroenterologist.  4. Leg varices Stable and very large. Continues to use support hose without aches, pains or edema.  5. Need for influenza vaccination - Flu vaccine HIGH DOSE PF  6. Need for pneumococcal vaccination - Pneumococcal polysaccharide vaccine 23-valent greater than or equal to 2yo subcutaneous/IM

## 2015-09-20 NOTE — Patient Instructions (Signed)

## 2015-09-21 ENCOUNTER — Telehealth: Payer: Self-pay

## 2015-09-21 LAB — COMPREHENSIVE METABOLIC PANEL
A/G RATIO: 1.7 (ref 1.1–2.5)
ALT: 12 IU/L (ref 0–44)
AST: 18 IU/L (ref 0–40)
Albumin: 4.3 g/dL (ref 3.5–4.8)
Alkaline Phosphatase: 49 IU/L (ref 39–117)
BUN/Creatinine Ratio: 19 (ref 10–22)
BUN: 21 mg/dL (ref 8–27)
Bilirubin Total: 0.6 mg/dL (ref 0.0–1.2)
CO2: 25 mmol/L (ref 18–29)
Calcium: 9.4 mg/dL (ref 8.6–10.2)
Chloride: 102 mmol/L (ref 97–106)
Creatinine, Ser: 1.13 mg/dL (ref 0.76–1.27)
GFR, EST AFRICAN AMERICAN: 75 mL/min/{1.73_m2} (ref >59)
GFR, EST NON AFRICAN AMERICAN: 65 mL/min/{1.73_m2} (ref >59)
GLOBULIN, TOTAL: 2.6 g/dL (ref 1.5–4.5)
GLUCOSE: 103 mg/dL — AB (ref 65–99)
POTASSIUM: 4.3 mmol/L (ref 3.5–5.2)
SODIUM: 141 mmol/L (ref 136–144)
Total Protein: 6.9 g/dL (ref 6.0–8.5)

## 2015-09-21 LAB — CBC WITH DIFFERENTIAL/PLATELET
BASOS: 1 %
Basophils Absolute: 0 10*3/uL (ref 0.0–0.2)
EOS (ABSOLUTE): 0 10*3/uL (ref 0.0–0.4)
EOS: 0 %
HEMATOCRIT: 39.8 % (ref 37.5–51.0)
Hemoglobin: 13.2 g/dL (ref 12.6–17.7)
IMMATURE GRANULOCYTES: 0 %
Immature Grans (Abs): 0 10*3/uL (ref 0.0–0.1)
Lymphocytes Absolute: 2.1 10*3/uL (ref 0.7–3.1)
Lymphs: 36 %
MCH: 31.4 pg (ref 26.6–33.0)
MCHC: 33.2 g/dL (ref 31.5–35.7)
MCV: 95 fL (ref 79–97)
MONOS ABS: 0.5 10*3/uL (ref 0.1–0.9)
Monocytes: 9 %
NEUTROS ABS: 3.2 10*3/uL (ref 1.4–7.0)
NEUTROS PCT: 54 %
PLATELETS: 216 10*3/uL (ref 150–379)
RBC: 4.2 x10E6/uL (ref 4.14–5.80)
RDW: 13.2 % (ref 12.3–15.4)
WBC: 5.9 10*3/uL (ref 3.4–10.8)

## 2015-09-21 LAB — LIPID PANEL
CHOL/HDL RATIO: 3.1 ratio (ref 0.0–5.0)
Cholesterol, Total: 209 mg/dL — ABNORMAL HIGH (ref 100–199)
HDL: 68 mg/dL (ref >39)
LDL Calculated: 127 mg/dL — ABNORMAL HIGH (ref 0–99)
Triglycerides: 68 mg/dL (ref 0–149)
VLDL CHOLESTEROL CAL: 14 mg/dL (ref 5–40)

## 2015-09-21 LAB — TSH: TSH: 1.85 u[IU]/mL (ref 0.450–4.500)

## 2015-09-21 NOTE — Telephone Encounter (Signed)
Advised patient as below. Patient would also like to know if a PSA would be covered under insurance? If so, could we add test to specimen that was collected on 09/20/15? Please advise patient if PSA is covered before adding test. Thanks!

## 2015-09-21 NOTE — Telephone Encounter (Signed)
LMTCB

## 2015-09-21 NOTE — Telephone Encounter (Signed)
-----   Message from Margo Common, [place] sent at 09/21/2015  8:17 AM EDT ----- Total cholesterol slightly above 200 but HDL still very good. LDL would be better at, or below, 100. Continue exercise program and lower fats in diet. Glucose still ever so slightly above 100, but not considered diabetic levels. Best way to help this is diet and exercise. Remainder of tests normal. Recheck lipids in 3 months to be sure it stays stable and not progressing upward.

## 2015-09-21 NOTE — Telephone Encounter (Signed)
Since PSA has been normal for the past 3 years (actually have records of normal since 2007) the newest recommendations indicate no need for testing every year. He could call his insurance representative or helpline to see if they still cover this test.

## 2015-09-22 NOTE — Telephone Encounter (Signed)
Patient advised as directed below. Patient verbalized understanding.  

## 2016-05-23 DIAGNOSIS — L820 Inflamed seborrheic keratosis: Secondary | ICD-10-CM | POA: Diagnosis not present

## 2016-05-23 DIAGNOSIS — D485 Neoplasm of uncertain behavior of skin: Secondary | ICD-10-CM | POA: Diagnosis not present

## 2016-05-23 DIAGNOSIS — L570 Actinic keratosis: Secondary | ICD-10-CM | POA: Diagnosis not present

## 2016-06-20 DIAGNOSIS — H2513 Age-related nuclear cataract, bilateral: Secondary | ICD-10-CM | POA: Diagnosis not present

## 2016-07-05 ENCOUNTER — Encounter: Payer: Self-pay | Admitting: *Deleted

## 2016-07-05 DIAGNOSIS — Z1283 Encounter for screening for malignant neoplasm of skin: Secondary | ICD-10-CM | POA: Diagnosis not present

## 2016-07-05 DIAGNOSIS — L570 Actinic keratosis: Secondary | ICD-10-CM | POA: Diagnosis not present

## 2016-07-05 DIAGNOSIS — D229 Melanocytic nevi, unspecified: Secondary | ICD-10-CM | POA: Diagnosis not present

## 2016-07-05 DIAGNOSIS — L820 Inflamed seborrheic keratosis: Secondary | ICD-10-CM | POA: Diagnosis not present

## 2016-07-05 DIAGNOSIS — L821 Other seborrheic keratosis: Secondary | ICD-10-CM | POA: Diagnosis not present

## 2016-07-05 DIAGNOSIS — L812 Freckles: Secondary | ICD-10-CM | POA: Diagnosis not present

## 2016-07-05 DIAGNOSIS — D485 Neoplasm of uncertain behavior of skin: Secondary | ICD-10-CM | POA: Diagnosis not present

## 2016-07-05 DIAGNOSIS — D1800 Hemangioma unspecified site: Secondary | ICD-10-CM | POA: Diagnosis not present

## 2016-07-05 DIAGNOSIS — L578 Other skin changes due to chronic exposure to nonionizing radiation: Secondary | ICD-10-CM | POA: Diagnosis not present

## 2016-07-06 ENCOUNTER — Encounter: Payer: Self-pay | Admitting: *Deleted

## 2016-07-06 ENCOUNTER — Ambulatory Visit
Admission: RE | Admit: 2016-07-06 | Discharge: 2016-07-06 | Disposition: A | Discharge status: Home / Self Care | Payer: Medicare Other | Source: Ambulatory Visit | Attending: Unknown Physician Specialty | Admitting: Unknown Physician Specialty

## 2016-07-06 ENCOUNTER — Encounter
Admission: RE | Disposition: A | Discharge status: Home / Self Care | Payer: Self-pay | Source: Ambulatory Visit | Attending: Unknown Physician Specialty

## 2016-07-06 ENCOUNTER — Ambulatory Visit: Payer: Medicare Other | Admitting: Anesthesiology

## 2016-07-06 DIAGNOSIS — Z87891 Personal history of nicotine dependence: Secondary | ICD-10-CM | POA: Insufficient documentation

## 2016-07-06 DIAGNOSIS — D125 Benign neoplasm of sigmoid colon: Secondary | ICD-10-CM | POA: Insufficient documentation

## 2016-07-06 DIAGNOSIS — I739 Peripheral vascular disease, unspecified: Secondary | ICD-10-CM | POA: Insufficient documentation

## 2016-07-06 DIAGNOSIS — K640 First degree hemorrhoids: Secondary | ICD-10-CM | POA: Insufficient documentation

## 2016-07-06 DIAGNOSIS — Z800 Family history of malignant neoplasm of digestive organs: Secondary | ICD-10-CM | POA: Diagnosis not present

## 2016-07-06 DIAGNOSIS — K210 Gastro-esophageal reflux disease with esophagitis: Secondary | ICD-10-CM | POA: Diagnosis not present

## 2016-07-06 DIAGNOSIS — F419 Anxiety disorder, unspecified: Secondary | ICD-10-CM | POA: Diagnosis not present

## 2016-07-06 DIAGNOSIS — D124 Benign neoplasm of descending colon: Secondary | ICD-10-CM | POA: Insufficient documentation

## 2016-07-06 DIAGNOSIS — Z79899 Other long term (current) drug therapy: Secondary | ICD-10-CM | POA: Diagnosis not present

## 2016-07-06 DIAGNOSIS — Z1211 Encounter for screening for malignant neoplasm of colon: Secondary | ICD-10-CM | POA: Diagnosis not present

## 2016-07-06 DIAGNOSIS — Z86010 Personal history of colonic polyps: Secondary | ICD-10-CM | POA: Diagnosis not present

## 2016-07-06 DIAGNOSIS — K648 Other hemorrhoids: Secondary | ICD-10-CM | POA: Diagnosis not present

## 2016-07-06 DIAGNOSIS — K317 Polyp of stomach and duodenum: Secondary | ICD-10-CM | POA: Diagnosis not present

## 2016-07-06 DIAGNOSIS — K219 Gastro-esophageal reflux disease without esophagitis: Secondary | ICD-10-CM | POA: Diagnosis not present

## 2016-07-06 DIAGNOSIS — K2270 Barrett's esophagus without dysplasia: Secondary | ICD-10-CM | POA: Insufficient documentation

## 2016-07-06 DIAGNOSIS — I10 Essential (primary) hypertension: Secondary | ICD-10-CM | POA: Diagnosis not present

## 2016-07-06 DIAGNOSIS — D122 Benign neoplasm of ascending colon: Secondary | ICD-10-CM | POA: Insufficient documentation

## 2016-07-06 DIAGNOSIS — K635 Polyp of colon: Secondary | ICD-10-CM | POA: Diagnosis not present

## 2016-07-06 DIAGNOSIS — D123 Benign neoplasm of transverse colon: Secondary | ICD-10-CM | POA: Diagnosis not present

## 2016-07-06 HISTORY — DX: Cardiac arrhythmia, unspecified: I49.9

## 2016-07-06 HISTORY — PX: ESOPHAGOGASTRODUODENOSCOPY (EGD) WITH PROPOFOL: SHX5813

## 2016-07-06 HISTORY — DX: Essential (primary) hypertension: I10

## 2016-07-06 HISTORY — DX: Anxiety disorder, unspecified: F41.9

## 2016-07-06 HISTORY — PX: COLONOSCOPY WITH PROPOFOL: SHX5780

## 2016-07-06 SURGERY — COLONOSCOPY WITH PROPOFOL
Anesthesia: General

## 2016-07-06 MED ORDER — SODIUM CHLORIDE 0.9 % IV SOLN: INTRAVENOUS | Status: DC

## 2016-07-06 MED ORDER — FENTANYL CITRATE (PF) 100 MCG/2ML IJ SOLN
INTRAMUSCULAR | Status: DC | PRN
  Administered 2016-07-06: 50 ug via INTRAVENOUS

## 2016-07-06 MED ORDER — LIDOCAINE HCL (CARDIAC) 10 MG/ML IV SOLN
INTRAVENOUS | Status: DC | PRN
  Administered 2016-07-06: 100 mg via INTRAVENOUS

## 2016-07-06 MED ORDER — SODIUM CHLORIDE 0.9 % IV SOLN
INTRAVENOUS | Status: DC
  Administered 2016-07-06: 10:00:00 via INTRAVENOUS

## 2016-07-06 MED ORDER — PROPOFOL 500 MG/50ML IV EMUL
INTRAVENOUS | Status: DC | PRN
  Administered 2016-07-06: 180 ug/kg/min via INTRAVENOUS

## 2016-07-06 NOTE — Transfer of Care (Signed)
Immediate Anesthesia Transfer of Care Note  Patient: Mike Spence  Procedure(s) Performed: Procedure(s): COLONOSCOPY WITH PROPOFOL (N/A) ESOPHAGOGASTRODUODENOSCOPY (EGD) WITH PROPOFOL (N/A)  Patient Location: PACU  Anesthesia Type:General  Level of Consciousness: sedated  Airway & Oxygen Therapy: Patient Spontanous Breathing and Patient connected to nasal cannula oxygen  Post-op Assessment: Report given to RN and Post -op Vital signs reviewed and stable  Post vital signs: Reviewed and stable  Last Vitals:  Vitals:   07/06/16 0946 07/06/16 1100  BP: (!) 176/99 (!) 116/49  Pulse: (!) 123 84  Resp: 18 18  Temp: (!) 36 C (!) 36.1 C    Last Pain:  Vitals:   07/06/16 1100  TempSrc: Tympanic         Complications: No apparent anesthesia complications

## 2016-07-06 NOTE — Op Note (Signed)
[facility] Gastroenterology Patient Name: Mike Spence Procedure Date: 07/06/2016 10:11 AM MRN: [id number] Account #: [id number] Date of Birth: 02-18-1946 Admit Type: Outpatient Age: 70 Room: [facility] ENDO ROOM 4 Gender: Male Note Status: Finalized Procedure:            Upper GI endoscopy Indications:          Heartburn, Follow-up of Barrett's esophagus Providers:            Manya Silvas, MD Referring MD:         No Local Md, MD (Referring MD) Medicines:            Propofol per Anesthesia Complications:        No immediate complications. Procedure:            Pre-Anesthesia Assessment:                       - After reviewing the risks and benefits, the patient                        was deemed in satisfactory condition to undergo the                        procedure.                       After obtaining informed consent, the endoscope was                        passed under direct vision. Throughout the procedure,                        the patient's blood pressure, pulse, and oxygen                        saturations were monitored continuously. The Endoscope                        was introduced through the mouth, and advanced to the                        second part of duodenum. The upper GI endoscopy was                        accomplished without difficulty. The patient tolerated                        the procedure well. Findings:      There were esophageal mucosal changes secondary to established       short-segment Barrett's disease present in the lower third of the       esophagus. The maximum longitudinal extent of these mucosal changes was       3 cm in length. Mucosa was biopsied with a cold forceps for histology.       One specimen bottle was sent to pathology.      Multiple medium pedunculated and sessile polyps with no bleeding and no       stigmata of recent bleeding were found in the gastric body.      The examined duodenum was  normal. Impression:           - Esophageal mucosal changes secondary  to established                        short-segment Barrett's disease. Biopsied.                       - Multiple gastric polyps.                       - Normal examined duodenum. Recommendation:       - Await pathology results.                       - Perform a colonoscopy as previously scheduled. Manya Silvas, MD 07/06/2016 10:28:20 AM This report has been signed electronically. Number of Addenda: 0 Note Initiated On: 07/06/2016 10:11 AM      [facility]

## 2016-07-06 NOTE — Anesthesia Preprocedure Evaluation (Signed)
Anesthesia Evaluation  Patient identified by MRN, date of birth, ID band Patient awake    Reviewed: Allergy & Precautions, H&P , NPO status , Patient's Chart, lab work & pertinent test results, reviewed documented beta blocker date and time   History of Anesthesia Complications Negative for: history of anesthetic complications  Airway Mallampati: I  TM Distance: >3 FB Neck ROM: full    Dental no notable dental hx. (+) Caps, Teeth Intact   Pulmonary neg pulmonary ROS, former smoker,    Pulmonary exam normal breath sounds clear to auscultation       Cardiovascular Exercise Tolerance: Good hypertension (white coat), (-) angina+ Peripheral Vascular Disease  (-) CAD, (-) Past MI, (-) Cardiac Stents and (-) CABG Normal cardiovascular exam+ dysrhythmias (PVCs) (-) Valvular Problems/Murmurs Rhythm:regular Rate:Normal     Neuro/Psych PSYCHIATRIC DISORDERS (Anxiety) negative neurological ROS     GI/Hepatic Neg liver ROS, GERD  ,  Endo/Other  negative endocrine ROS  Renal/GU negative Renal ROS  negative genitourinary   Musculoskeletal   Abdominal   Peds  Hematology negative hematology ROS (+)   Anesthesia Other Findings Past Medical History: No date: Anxiety No date: Dysrhythmia No date: GERD (gastroesophageal reflux disease) No date: Hypertension No date: Palpitations     Comment: Hx of them- identified as PVC's. did a holter               monitor in 2/10 showing frequent PVC's.               Additionally, the patient had an echcardiogram               done in 2/10. EF 55%. there was some abnormal               septal motion that may have been due to               frequent PVC's. there was no significant               valvular disorder and the RV was normal in size              and function.   Reproductive/Obstetrics negative OB ROS                             Anesthesia Physical Anesthesia  Plan  ASA: II  Anesthesia Plan: General   Post-op Pain Management:    Induction:   Airway Management Planned:   Additional Equipment:   Intra-op Plan:   Post-operative Plan:   Informed Consent: I have reviewed the patients History and Physical, chart, labs and discussed the procedure including the risks, benefits and alternatives for the proposed anesthesia with the patient or authorized representative who has indicated his/her understanding and acceptance.   Dental Advisory Given  Plan Discussed with: Anesthesiologist, CRNA and Surgeon  Anesthesia Plan Comments:         Anesthesia Quick Evaluation

## 2016-07-06 NOTE — H&P (Signed)
Primary Care Physician:  [name], PA Primary Gastroenterologist:  Dr. Vira Agar  Pre-Procedure History & Physical: HPI:  Mike Spence is a 70 y.o. male is here for an endoscopy and colonoscopy.   Past Medical History:  Diagnosis Date  . Anxiety   . Dysrhythmia   . GERD (gastroesophageal reflux disease)   . Hypertension   . Palpitations    Hx of them- identified as PVC's. did a holter monitor in 2/10 showing frequent PVC's. Additionally, the patient had an echcardiogram done in 2/10. EF 55%. there was some abnormal septal motion that may have been due to frequent PVC's. there was no significant valvular disorder and the RV was normal in size and function.    Past Surgical History:  Procedure Laterality Date  . RETINAL DETACHMENT SURGERY  2008  . TONSILLECTOMY AND ADENOIDECTOMY  1950's  . VARICOSE VEIN SURGERY  2013    Prior to Admission medications   Medication Sig Start Date End Date Taking? Authorizing Provider  Cholecalciferol (VITAMIN D) 2000 UNITS CAPS Take by mouth.   Yes Historical Provider, MD  vitamin B-12 (CYANOCOBALAMIN) 250 MCG tablet Take by mouth.   Yes Historical Provider, MD  vitamin E 400 UNIT capsule Take by mouth.   Yes Historical Provider, MD  lisinopril (PRINIVIL,ZESTRIL) 5 MG tablet Take 5 mg by mouth daily.      Historical Provider, MD  magnesium citrate SOLN Take 296 mLs by mouth once.      Historical Provider, MD  omeprazole (PRILOSEC) 20 MG capsule Take 20 mg by mouth 2 (two) times daily.      Historical Provider, MD    Allergies as of 06/09/2016 - Review Complete 09/20/2015  Allergen Reaction Noted  . Aspirin    . Gluten meal Other (See Comments) 09/20/2015    Family History  Problem Relation Age of Onset  . Hypertension Mother   . Dementia Mother   . Colon cancer Mother   . Kidney disease Father   . Hypertension Father   . Diabetes Father   . Dementia Father   . Colon cancer Sister   . Mitral valve prolapse Daughter   . Anxiety  disorder Daughter   . Ulcerative colitis Son   . Heart attack Maternal Grandfather   . Colon cancer Paternal Grandmother   . CVA Paternal Grandfather   . Coronary artery disease Neg Hx     Social History   Social History  . Marital status: Married    Spouse name: N/A  . Number of children: N/A  . Years of education: N/A   Occupational History  . Not on file.   Social History Main Topics  . Smoking status: Former Research scientist (life sciences)  . Smokeless tobacco: Never Used     Comment: smoke-no  . Alcohol use No  . Drug use: No  . Sexual activity: Not on file   Other Topics Concern  . Not on file   Social History Narrative   Married and lives in [place] with wife. Works for Doctor, general practice.     Review of Systems: See HPI, otherwise negative ROS  Physical Exam: BP (!) 176/99   Pulse (!) 123   Temp (!) 96.8 F (36 C) (Tympanic)   Resp 18   Ht 5\' 10"  (1.778 m)   Wt 75.8 kg (167 lb)   SpO2 100%   BMI 23.96 kg/m  General:   Alert,  pleasant and cooperative in NAD Head:  Normocephalic and atraumatic. Neck:  Supple; no masses  or thyromegaly. Lungs:  Clear throughout to auscultation.    Heart:  Regular rate and rhythm. Abdomen:  Soft, nontender and nondistended. Normal bowel sounds, without guarding, and without rebound.   Neurologic:  Alert and  oriented x4;  grossly normal neurologically.  Impression/Plan: Mike Spence is here for an endoscopy and colonoscopy to be performed for [facility] colon polyps and [facility] [address]  Risks, benefits, limitations, and alternatives regarding  endoscopy and colonoscopy have been reviewed with the patient.  Questions have been answered.  All parties agreeable.   Gaylyn Cheers, MD  07/06/2016, 10:07 AM

## 2016-07-06 NOTE — Anesthesia Procedure Notes (Signed)
Date/Time: 07/06/2016 10:30 AM Performed by: Allean Found Pre-anesthesia Checklist: Patient identified, Emergency Drugs available, Suction available, Patient being monitored and Timeout performed Patient Re-evaluated:Patient Re-evaluated prior to inductionOxygen Delivery Method: Nasal cannula Preoxygenation: Pre-oxygenation with 100% oxygen Intubation Type: IV induction

## 2016-07-06 NOTE — Anesthesia Postprocedure Evaluation (Signed)
Anesthesia Post Note  Patient: Mike Spence  Procedure(s) Performed: Procedure(s) (LRB): COLONOSCOPY WITH PROPOFOL (N/A) ESOPHAGOGASTRODUODENOSCOPY (EGD) WITH PROPOFOL (N/A)  Patient location during evaluation: Endoscopy Anesthesia Type: General Level of consciousness: awake and alert Pain management: pain level controlled Vital Signs Assessment: post-procedure vital signs reviewed and stable Respiratory status: spontaneous breathing, nonlabored ventilation, respiratory function stable and patient connected to nasal cannula oxygen Cardiovascular status: blood pressure returned to baseline and stable Postop Assessment: no signs of nausea or vomiting Anesthetic complications: no    Last Vitals:  Vitals:   07/06/16 1120 07/06/16 1130  BP: (!) 131/57 135/65  Pulse:    Resp:    Temp:      Last Pain:  Vitals:   07/06/16 1100  TempSrc: Tympanic                 Martha Clan

## 2016-07-06 NOTE — Op Note (Signed)
[facility] Gastroenterology Patient Name: Mike Spence Procedure Date: 07/06/2016 10:10 AM MRN: [id number] Account #: [id number] Date of Birth: 26-Jun-1946 Admit Type: Outpatient Age: 71 Room: [facility] ENDO ROOM 4 Gender: Male Note Status: Finalized Procedure:            Colonoscopy Indications:          High risk colon cancer surveillance: Personal history                        of colonic polyps Providers:            Manya Silvas, MD Referring MD:         No Local Md, MD (Referring MD) Medicines:            Propofol per Anesthesia Complications:        No immediate complications. Procedure:            Pre-Anesthesia Assessment:                       - After reviewing the risks and benefits, the patient                        was deemed in satisfactory condition to undergo the                        procedure.                       After obtaining informed consent, the colonoscope was                        passed under direct vision. Throughout the procedure,                        the patient's blood pressure, pulse, and oxygen                        saturations were monitored continuously. The                        Colonoscope was introduced through the anus and                        advanced to the the cecum, identified by appendiceal                        orifice and ileocecal valve. The colonoscopy was                        performed without difficulty. The patient tolerated the                        procedure well. The quality of the bowel preparation                        was excellent. Findings:      Two sessile polyps were found in the ascending colon. The polyps were       small in size. These polyps were removed with a hot snare. Resection and       retrieval were complete.      Three sessile polyps  were found in the transverse colon. The polyps were       diminutive in size. These polyps were removed with a jumbo cold forceps.   Resection and retrieval were complete. Put in two bottles      A diminutive polyp was found in the descending colon. The polyp was       sessile. The polyp was removed with a jumbo cold forceps. Resection and       retrieval were complete.      A diminutive polyp was found in the sigmoid colon. The polyp was       sessile. The polyp was removed with a cold biopsy forceps. Resection and       retrieval were complete.      Internal hemorrhoids were found during endoscopy. The hemorrhoids were       small and Grade I (internal hemorrhoids that do not prolapse). Impression:           - Two small polyps in the ascending colon, removed with                        a hot snare. Resected and retrieved.                       - Three diminutive polyps in the transverse colon,                        removed with a jumbo cold forceps. Resected and                        retrieved.                       - One diminutive polyp in the descending colon, removed                        with a jumbo cold forceps. Resected and retrieved.                       - One diminutive polyp in the sigmoid colon, removed                        with a cold biopsy forceps. Resected and retrieved.                       - Internal hemorrhoids. Recommendation:       - Await pathology results. Manya Silvas, MD 07/06/2016 11:04:01 AM This report has been signed electronically. Number of Addenda: 0 Note Initiated On: 07/06/2016 10:10 AM Scope Withdrawal Time: 0 hours 24 minutes 54 seconds  Total Procedure Duration: 0 hours 28 minutes 21 seconds       [facility]

## 2016-07-07 ENCOUNTER — Encounter: Payer: Self-pay | Admitting: Unknown Physician Specialty

## 2016-07-07 LAB — SURGICAL PATHOLOGY

## 2016-10-17 DIAGNOSIS — L820 Inflamed seborrheic keratosis: Secondary | ICD-10-CM | POA: Diagnosis not present

## 2016-10-17 DIAGNOSIS — L821 Other seborrheic keratosis: Secondary | ICD-10-CM | POA: Diagnosis not present

## 2016-10-17 DIAGNOSIS — L578 Other skin changes due to chronic exposure to nonionizing radiation: Secondary | ICD-10-CM | POA: Diagnosis not present

## 2017-01-03 ENCOUNTER — Ambulatory Visit (INDEPENDENT_AMBULATORY_CARE_PROVIDER_SITE_OTHER): Payer: Medicare Other

## 2017-01-03 VITALS — BP 152/88 | HR 80 | Temp 97.9°F | Wt 174.8 lb

## 2017-01-03 DIAGNOSIS — Z0000 Encounter for general adult medical examination without abnormal findings: Secondary | ICD-10-CM | POA: Diagnosis not present

## 2017-01-03 DIAGNOSIS — Z23 Encounter for immunization: Secondary | ICD-10-CM | POA: Diagnosis not present

## 2017-01-03 NOTE — Progress Notes (Signed)
Subjective:   Mike Spence is a 71 y.o. male who presents for Medicare Annual/Subsequent preventive examination.  Review of Systems:  N/A  Cardiac Risk Factors include: advanced age (>96men, >60 women);hypertension;male gender     Objective:    Vitals: BP (!) 152/88 (BP Location: Right Arm)   Pulse 80   Temp 97.9 F (36.6 C) (Oral)   Wt 174 lb 12.8 oz (79.3 kg)   BMI 25.08 kg/m   Body mass index is 25.08 kg/m.  Tobacco History  Smoking Status  . Former Smoker  . Years: 1.00  . Types: Cigarettes  Smokeless Tobacco  . Never Used    Comment: >50 years ago in college     Counseling given: Not Answered   Past Medical History:  Diagnosis Date  . Anxiety   . Dysrhythmia   . GERD (gastroesophageal reflux disease)   . Hypertension   . Palpitations    Hx of them- identified as PVC's. did a holter monitor in 2/10 showing frequent PVC's. Additionally, the patient had an echcardiogram done in 2/10. EF 55%. there was some abnormal septal motion that may have been due to frequent PVC's. there was no significant valvular disorder and the RV was normal in size and function.   Past Surgical History:  Procedure Laterality Date  . COLONOSCOPY WITH PROPOFOL N/A 07/06/2016   Procedure: COLONOSCOPY WITH PROPOFOL;  Surgeon: Manya Silvas, MD;  Location: [facility] ENDOSCOPY;  Service: Endoscopy;  Laterality: N/A;  . ESOPHAGOGASTRODUODENOSCOPY (EGD) WITH PROPOFOL N/A 07/06/2016   Procedure: ESOPHAGOGASTRODUODENOSCOPY (EGD) WITH PROPOFOL;  Surgeon: Manya Silvas, MD;  Location: [facility] ENDOSCOPY;  Service: Endoscopy;  Laterality: N/A;  . RETINAL DETACHMENT SURGERY  2008  . TONSILLECTOMY AND ADENOIDECTOMY  1950's  . VARICOSE VEIN SURGERY  2013   Family History  Problem Relation Age of Onset  . Hypertension Mother   . Dementia Mother   . Colon cancer Mother   . Kidney disease Father   . Hypertension Father   . Diabetes Father   . Dementia Father   . Colon cancer Sister   . Mitral  valve prolapse Daughter   . Anxiety disorder Daughter   . Ulcerative colitis Son   . Heart attack Maternal Grandfather   . Colon cancer Paternal Grandmother   . CVA Paternal Grandfather   . Coronary artery disease Neg Hx    History  Sexual Activity  . Sexual activity: Not on file    Outpatient Encounter Prescriptions as of 01/03/2017  Medication Sig  . Cholecalciferol (VITAMIN D) 2000 UNITS CAPS Take by mouth.  . Cholecalciferol (VITAMIN D3) 400 units CAPS Take 1 capsule by mouth daily.  . magnesium citrate SOLN Take 296 mLs by mouth once.   Marland Kitchen omeprazole (PRILOSEC) 20 MG capsule Take 20 mg by mouth daily.   . vitamin B-12 (CYANOCOBALAMIN) 250 MCG tablet Take by mouth.  Marland Kitchen lisinopril (PRINIVIL,ZESTRIL) 5 MG tablet Take 5 mg by mouth daily.    . vitamin E 400 UNIT capsule Take by mouth.   No facility-administered encounter medications on file as of 01/03/2017.     Activities of Daily Living In your present state of health, do you have any difficulty performing the following activities: 01/03/2017  Hearing? N  Vision? N  Difficulty concentrating or making decisions? N  Walking or climbing stairs? N  Dressing or bathing? N  Doing errands, shopping? N  Preparing Food and eating ? N  Using the Toilet? N  In the past six  months, have you accidently leaked urine? N  Do you have problems with loss of bowel control? N  Managing your Medications? N  Managing your Finances? N  Housekeeping or managing your Housekeeping? N  Some recent data might be hidden    Patient Care Team: Margo Common, PA as PCP - General (Physician Assistant) Birder Robson, MD as Referring Physician (Ophthalmology) Manya Silvas, MD as Consulting Physician (Gastroenterology)   Assessment:     Exercise Activities and Dietary recommendations Current Exercise Habits: Structured exercise class, Type of exercise: walking;yoga;Other - see comments;strength training/weights;stretching (swimming,  bicycle), Time (Minutes): 45 (to 1.5 hours), Frequency (Times/Week): 7, Weekly Exercise (Minutes/Week): 315, Intensity: Moderate  Goals    . Increase water intake          01/03/17, I will increase my water intake to 4-5 glasses a day.      Fall Risk Fall Risk  01/03/2017 09/20/2015  Falls in the past year? No No   Depression Screen PHQ 2/9 Scores 01/03/2017 09/20/2015  PHQ - 2 Score 0 0    Cognitive Function     6CIT Screen 01/03/2017  What Year? 0 points  What month? 0 points  What time? 0 points  Count back from 20 0 points  Months in reverse 0 points  Repeat phrase 2 points  Total Score 2    Immunization History  Administered Date(s) Administered  . Influenza, High Dose Seasonal PF 09/20/2015, 01/03/2017  . Pneumococcal Conjugate-13 05/07/2014  . Pneumococcal Polysaccharide-23 09/20/2015  . Tdap 07/04/2011   Screening Tests Health Maintenance  Topic Date Due  . Hepatitis C Screening  01/09/2017 (Originally July 08, 1946)  . ZOSTAVAX  12/04/2026 (Originally 09/07/2004)  . TETANUS/TDAP  07/03/2021  . COLONOSCOPY  07/06/2026  . INFLUENZA VACCINE  Completed  . PNA vac Low Risk Adult  Completed      Plan:  I have personally reviewed and addressed the Medicare Annual Wellness questionnaire and have noted the following in the patient's chart:  A. Medical and social history B. Use of alcohol, tobacco or illicit drugs  C. Current medications and supplements D. Functional ability and status E.  Nutritional status F.  Physical activity G. Advance directives H. List of other physicians I.  Hospitalizations, surgeries, and ER visits in previous 12 months J.  [place] such as hearing and vision if needed, cognitive and depression L. Referrals and appointments - none  In addition, I have reviewed and discussed with patient certain preventive protocols, quality metrics, and best practice recommendations. A written personalized care plan for preventive services  as well as general preventive health recommendations were provided to patient.  See attached scanned questionnaire for additional information.   Signed,  Fabio Neighbors, LPN Nurse Health Advisor   MD Recommendations: Follow up on Hepatitis C Screening decision. Pt to check with insurance and spouse before next apt to decide about having the screening done or not.   Reviewed the evaluation and recommendations by the Nurse Health Advisor for this patient's Medicare Wellness Exam. Will discuss with patient at his next appointment on 01-09-17. Agree with recommendations.

## 2017-01-03 NOTE — Patient Instructions (Signed)

## 2017-01-09 ENCOUNTER — Encounter: Payer: Self-pay | Admitting: Family Medicine

## 2017-01-09 ENCOUNTER — Ambulatory Visit (INDEPENDENT_AMBULATORY_CARE_PROVIDER_SITE_OTHER): Payer: Medicare Other | Admitting: Family Medicine

## 2017-01-09 VITALS — BP 158/92 | HR 65 | Temp 97.6°F | Resp 14 | Wt 170.6 lb

## 2017-01-09 DIAGNOSIS — F411 Generalized anxiety disorder: Secondary | ICD-10-CM | POA: Diagnosis not present

## 2017-01-09 DIAGNOSIS — K22710 Barrett's esophagus with low grade dysplasia: Secondary | ICD-10-CM

## 2017-01-09 DIAGNOSIS — I8390 Asymptomatic varicose veins of unspecified lower extremity: Secondary | ICD-10-CM | POA: Diagnosis not present

## 2017-01-09 DIAGNOSIS — I10 Essential (primary) hypertension: Secondary | ICD-10-CM

## 2017-01-09 DIAGNOSIS — N401 Enlarged prostate with lower urinary tract symptoms: Secondary | ICD-10-CM | POA: Diagnosis not present

## 2017-01-09 DIAGNOSIS — Z1159 Encounter for screening for other viral diseases: Secondary | ICD-10-CM | POA: Diagnosis not present

## 2017-01-09 DIAGNOSIS — N3943 Post-void dribbling: Secondary | ICD-10-CM | POA: Diagnosis not present

## 2017-01-09 LAB — POCT URINALYSIS DIPSTICK
Bilirubin, UA: NEGATIVE
Blood, UA: NEGATIVE
GLUCOSE UA: NEGATIVE
KETONES UA: NEGATIVE
Leukocytes, UA: NEGATIVE
Nitrite, UA: NEGATIVE
Protein, UA: NEGATIVE
Urobilinogen, UA: 0.2
pH, UA: 6

## 2017-01-09 MED ORDER — LORAZEPAM 0.5 MG PO TABS
0.5000 mg | ORAL_TABLET | Freq: Two times a day (BID) | ORAL | 1 refills | Status: DC | PRN
Start: 2017-01-09 — End: 2018-07-01

## 2017-01-09 NOTE — Progress Notes (Signed)
Patient: Mike Spence Male    DOB: February 24, 1946   71 y.o.   MRN: [id number] Visit Date: 01/09/2017  Today's Provider: [name], PA   Chief Complaint  Patient presents with  . Follow-up   Subjective:    HPI   Patient is here to follow up from [facility] on 01/03/2017.    Hypertension, follow-up:  BP Readings from Last 3 Encounters:  01/09/17 (!) 158/92  01/03/17 (!) 152/88  07/06/16 135/65    He was last seen for hypertension 1 years ago.  BP at that visit was 170/90. Management changes since that visit include exercise and lower fats in diet. He reports good compliance with treatment. He is not having side effects.  He is exercising. He is adherent to low salt diet.   Outside blood pressures are being checked. He is experiencing none.  Patient denies chest pain, chest pressure/discomfort, irregular heart beat and palpitations.   Cardiovascular risk factors include advanced age (older than 30 for men, 6 for women) and male gender.  Use of agents associated with hypertension: none.     Weight trend: stable Wt Readings from Last 3 Encounters:  01/09/17 170 lb 9.6 oz (77.4 kg)  01/03/17 174 lb 12.8 oz (79.3 kg)  07/06/16 167 lb (75.8 kg)    Current diet: well balanced  ------------------------------------------------------------------------ Past Medical History:  Diagnosis Date  . Anxiety   . Dysrhythmia   . GERD (gastroesophageal reflux disease)   . Hypertension   . Palpitations    Hx of them- identified as PVC's. did a holter monitor in 2/10 showing frequent PVC's. Additionally, the patient had an echcardiogram done in 2/10. EF 55%. there was some abnormal septal motion that may have been due to frequent PVC's. there was no significant valvular disorder and the RV was normal in size and function.   Past Surgical History:  Procedure Laterality Date  . COLONOSCOPY WITH PROPOFOL N/A 07/06/2016   Procedure: COLONOSCOPY WITH PROPOFOL;  Surgeon: Manya Silvas,  MD;  Location: [facility] ENDOSCOPY;  Service: Endoscopy;  Laterality: N/A;  . ESOPHAGOGASTRODUODENOSCOPY (EGD) WITH PROPOFOL N/A 07/06/2016   Procedure: ESOPHAGOGASTRODUODENOSCOPY (EGD) WITH PROPOFOL;  Surgeon: Manya Silvas, MD;  Location: [facility] ENDOSCOPY;  Service: Endoscopy;  Laterality: N/A;  . RETINAL DETACHMENT SURGERY  2008  . TONSILLECTOMY AND ADENOIDECTOMY  1950's  . VARICOSE VEIN SURGERY  2013   Family History  Problem Relation Age of Onset  . Hypertension Mother   . Dementia Mother   . Colon cancer Mother   . Kidney disease Father   . Hypertension Father   . Diabetes Father   . Dementia Father   . Colon cancer Sister   . Mitral valve prolapse Daughter   . Anxiety disorder Daughter   . Ulcerative colitis Son   . Heart attack Maternal Grandfather   . Colon cancer Paternal Grandmother   . CVA Paternal Grandfather   . Coronary artery disease Neg Hx    Allergies  Allergen Reactions  . Aspirin     REACTION: upsets stomach  . Gluten Meal Other (See Comments)     Previous Medications   CHOLECALCIFEROL (VITAMIN D) 2000 UNITS CAPS    Take by mouth.   CHOLECALCIFEROL (VITAMIN D3) 400 UNITS CAPS    Take 1 capsule by mouth daily.   LISINOPRIL (PRINIVIL,ZESTRIL) 5 MG TABLET    Take 5 mg by mouth daily.     MAGNESIUM CITRATE SOLN    Take 296 mLs by mouth once.  OMEPRAZOLE (PRILOSEC) 20 MG CAPSULE    Take 20 mg by mouth daily.    VITAMIN B-12 (CYANOCOBALAMIN) 250 MCG TABLET    Take by mouth.   VITAMIN E 400 UNIT CAPSULE    Take by mouth.    Review of Systems  Constitutional: Negative.   HENT: Negative.   Eyes: Negative.   Respiratory: Negative.   Cardiovascular: Negative.   Gastrointestinal: Negative.   Endocrine: Negative.   Genitourinary: Negative.   Musculoskeletal: Negative.   Skin: Negative.   Allergic/Immunologic: Positive for food allergies.  Neurological: Negative.   Hematological: Negative.   Psychiatric/Behavioral: Negative.     Social History  Substance  Use Topics  . Smoking status: Former Smoker    Years: 1.00    Types: Cigarettes  . Smokeless tobacco: Never Used     Comment: >50 years ago in college  . Alcohol use No   Objective:   BP (!) 158/92 (BP Location: Right Arm, Patient Position: Sitting, Cuff Size: Normal)   Pulse 65   Temp 97.6 F (36.4 C) (Oral)   Resp 14   Wt 170 lb 9.6 oz (77.4 kg)   SpO2 99%   BMI 24.48 kg/m   Physical Exam  Constitutional: He is oriented to person, place, and time. He appears well-developed and well-nourished.  HENT:  Head: Normocephalic and atraumatic.  Right Ear: External ear normal.  Left Ear: External ear normal.  Nose: Nose normal.  Mouth/Throat: Oropharynx is clear and moist.  Eyes: Conjunctivae and EOM are normal. Pupils are equal, round, and reactive to light. Right eye exhibits no discharge.  Neck: Normal range of motion. Neck supple. No tracheal deviation present. No thyromegaly present.  Cardiovascular: Normal rate, regular rhythm, normal heart sounds and intact distal pulses.   No murmur heard. Very large varicose veins both legs from thighs to ankles.  Pulmonary/Chest: Effort normal and breath sounds normal. No respiratory distress. He has no wheezes. He has no rales. He exhibits no tenderness.  Abdominal: Soft. He exhibits no distension and no mass. There is no tenderness. There is no rebound and no guarding.  Genitourinary: Penis normal. Rectal exam shows guaiac negative stool.  Genitourinary Comments: Scrotal varicoceles.  Musculoskeletal: Normal range of motion. He exhibits no edema or tenderness.  Lymphadenopathy:    He has no cervical adenopathy.  Neurological: He is alert and oriented to person, place, and time. He has normal reflexes. No cranial nerve deficit. He exhibits normal muscle tone. Coordination normal.  Skin: Skin is warm and dry. No rash noted. No erythema.  Psychiatric: He has a normal mood and affect. His behavior is normal. Judgment and thought content  normal.      Assessment & Plan:   1. Essential (primary) hypertension "[place] Syndrome" elevation every time he comes to the office. Brought home readings that are in the 100-110/70-80 range. Continues to exercise daily (swimming). Will check routine labs. Unable to take the Lisinopril without hypotensive drops (90/60). No longer taking it and continues to monitor at home. Normal general exam and Medicare Wellness assessment reviewed. - CBC with Differential/Platelet - Comprehensive metabolic panel - Lipid panel - Magnesium - POCT urinalysis dipstick  2. Leg varices Asymptomatic but very large varicose veins both legs from thighs to ankles. Has had multiple vascular procedures in the past but they "always come back". Continues to exercise regularly. Sees vascular specialist prn.  3. Barrett's esophagus with low grade dysplasia Followed by Dr. Vira Agar (gastroenterologist) and still on Prilosec daily. Had upper  endoscopy and colonoscopy on 07-06-16. Showed multiple gastric polyps and short-segment Barrett's Esophagus with 7 colon polyps (all lesions were benign). Recheck labs. Denies hematemesis or hematochezia. No abdominal pain. - CBC with Differential/Platelet - Comprehensive metabolic panel - Magnesium  4. Anxiety, generalized Still has episodes of increased anxiety with performance demands or stressful events. Finds BP will elevate when this happens, also. Will refill Lorazepam to use when panic is significant. Recheck labs. - Comprehensive metabolic panel - LORazepam (ATIVAN) 0.5 MG tablet; Take 1 tablet (0.5 mg total) by mouth 2 (two) times daily as needed for anxiety.  Dispense: 30 tablet; Refill: 1  5. Need for hepatitis C screening test - Hepatitis C antibody  6. Benign prostatic hyperplasia with post-void dribbling Decrease in urine stream force and some post-void dribbling intermittently. No significant nocturia (1-2 times a night). No hematuria. Will check UA and PSA. -  PSA - POCT urinalysis dipstick

## 2017-01-10 LAB — LIPID PANEL
CHOL/HDL RATIO: 2.8 ratio (ref 0.0–5.0)
Cholesterol, Total: 219 mg/dL — ABNORMAL HIGH (ref 100–199)
HDL: 78 mg/dL (ref >39)
LDL Calculated: 129 mg/dL — ABNORMAL HIGH (ref 0–99)
Triglycerides: 62 mg/dL (ref 0–149)
VLDL Cholesterol Cal: 12 mg/dL (ref 5–40)

## 2017-01-10 LAB — COMPREHENSIVE METABOLIC PANEL
A/G RATIO: 1.8 (ref 1.2–2.2)
ALBUMIN: 4.6 g/dL (ref 3.5–4.8)
ALK PHOS: 53 IU/L (ref 39–117)
ALT: 19 IU/L (ref 0–44)
AST: 16 IU/L (ref 0–40)
BILIRUBIN TOTAL: 0.6 mg/dL (ref 0.0–1.2)
BUN/Creatinine Ratio: 17 (ref 10–24)
BUN: 18 mg/dL (ref 8–27)
CHLORIDE: 101 mmol/L (ref 96–106)
CO2: 27 mmol/L (ref 18–29)
Calcium: 9.7 mg/dL (ref 8.6–10.2)
Creatinine, Ser: 1.08 mg/dL (ref 0.76–1.27)
GFR calc non Af Amer: 68 mL/min/{1.73_m2} (ref >59)
GFR, EST AFRICAN AMERICAN: 79 mL/min/{1.73_m2} (ref >59)
GLOBULIN, TOTAL: 2.6 g/dL (ref 1.5–4.5)
Glucose: 105 mg/dL — ABNORMAL HIGH (ref 65–99)
Potassium: 4.2 mmol/L (ref 3.5–5.2)
SODIUM: 142 mmol/L (ref 134–144)
TOTAL PROTEIN: 7.2 g/dL (ref 6.0–8.5)

## 2017-01-10 LAB — CBC WITH DIFFERENTIAL/PLATELET
BASOS ABS: 0 10*3/uL (ref 0.0–0.2)
BASOS: 0 %
EOS (ABSOLUTE): 0 10*3/uL (ref 0.0–0.4)
Eos: 0 %
HEMATOCRIT: 41.3 % (ref 37.5–51.0)
HEMOGLOBIN: 13 g/dL (ref 13.0–17.7)
IMMATURE GRANS (ABS): 0 10*3/uL (ref 0.0–0.1)
Immature Granulocytes: 0 %
LYMPHS ABS: 3 10*3/uL (ref 0.7–3.1)
LYMPHS: 41 %
MCH: 31.9 pg (ref 26.6–33.0)
MCHC: 31.5 g/dL (ref 31.5–35.7)
MCV: 101 fL — AB (ref 79–97)
MONOCYTES: 9 %
Monocytes Absolute: 0.7 10*3/uL (ref 0.1–0.9)
NEUTROS ABS: 3.5 10*3/uL (ref 1.4–7.0)
Neutrophils: 50 %
Platelets: 236 10*3/uL (ref 150–379)
RBC: 4.08 x10E6/uL — ABNORMAL LOW (ref 4.14–5.80)
RDW: 13.4 % (ref 12.3–15.4)
WBC: 7.2 10*3/uL (ref 3.4–10.8)

## 2017-01-10 LAB — MAGNESIUM: Magnesium: 2.3 mg/dL (ref 1.6–2.3)

## 2017-01-10 LAB — PSA: PROSTATE SPECIFIC AG, SERUM: 4.1 ng/mL — AB (ref 0.0–4.0)

## 2017-01-10 LAB — HEPATITIS C ANTIBODY

## 2017-01-11 ENCOUNTER — Telehealth: Payer: Self-pay

## 2017-01-11 NOTE — Telephone Encounter (Signed)
-----   Message from Margo Common, [place] sent at 01/11/2017  1:41 PM EST ----- Normal blood tests except LDL cholesterol still up some. HDL better than last year, which lowers risk of CV events. PSA borderline but probably related to benign prostatic hypertrophy. Proceed with present medications, exercise and diet. Recheck annually.

## 2017-01-11 NOTE — Telephone Encounter (Signed)
lmtcb Fairy Ashlock Drozdowski, CMA  

## 2017-01-12 NOTE — Telephone Encounter (Signed)
Advised pt of lab results. Pt verbally acknowledges understanding. Lola Lofaro Drozdowski, CMA   

## 2017-02-20 ENCOUNTER — Telehealth: Payer: Self-pay | Admitting: Family Medicine

## 2017-02-20 NOTE — Telephone Encounter (Signed)
Pt contacted office for refill request on the following medications: lisinopril (PRINIVIL,ZESTRIL) 5 MG tablet [facility] [address]. Pt stated that he was last given this medication in 2011 by Simona Huh and he would like to start taking the medication again. Pt was advised that Simona Huh is out of the office tomorrow. LOV: 01/09/17. Please advise. Thanks TNP

## 2017-02-22 ENCOUNTER — Other Ambulatory Visit: Payer: Self-pay | Admitting: Family Medicine

## 2017-02-22 DIAGNOSIS — I10 Essential (primary) hypertension: Secondary | ICD-10-CM

## 2017-02-22 MED ORDER — LISINOPRIL 5 MG PO TABS: 5.0000 mg | ORAL_TABLET | Freq: Every day | ORAL | 3 refills | Status: DC

## 2017-02-22 NOTE — Telephone Encounter (Signed)
Done. Monitor BP and call report of home readings in 2 weeks with the Lisinopril.

## 2017-02-22 NOTE — Telephone Encounter (Signed)
Pt advised.   Thanks,   -Kelcey Korus  

## 2017-09-24 DIAGNOSIS — Z8719 Personal history of other diseases of the digestive system: Secondary | ICD-10-CM | POA: Diagnosis not present

## 2017-09-24 DIAGNOSIS — F411 Generalized anxiety disorder: Secondary | ICD-10-CM | POA: Diagnosis not present

## 2017-09-24 DIAGNOSIS — K219 Gastro-esophageal reflux disease without esophagitis: Secondary | ICD-10-CM | POA: Diagnosis not present

## 2017-09-28 DIAGNOSIS — Z8719 Personal history of other diseases of the digestive system: Secondary | ICD-10-CM | POA: Insufficient documentation

## 2017-12-13 ENCOUNTER — Telehealth: Payer: Self-pay

## 2017-12-14 NOTE — Telephone Encounter (Signed)
Pt returned call. Pt is scheduled for AWV with [facility] on 01/10/18 @ 845 and CPE with Simona Huh on 01/17/18 @ 9. Thanks TNP

## 2018-01-10 ENCOUNTER — Ambulatory Visit (INDEPENDENT_AMBULATORY_CARE_PROVIDER_SITE_OTHER): Payer: PPO

## 2018-01-10 VITALS — BP 160/82 | HR 88 | Temp 97.8°F | Ht 70.0 in | Wt 169.0 lb

## 2018-01-10 DIAGNOSIS — Z0000 Encounter for general adult medical examination without abnormal findings: Secondary | ICD-10-CM | POA: Diagnosis not present

## 2018-01-10 NOTE — Progress Notes (Signed)
Subjective:   Mike Spence is a 72 y.o. male who presents for Medicare Annual/Subsequent preventive examination.  Review of Systems:  N/A  Cardiac Risk Factors include: advanced age (>12men, >75 women);hypertension;male gender     Objective:    Vitals: BP (!) 160/82 (BP Location: Left Arm)   Pulse 88   Temp 97.8 F (36.6 C) (Oral)   Ht 5\' 10"  (1.778 m)   Wt 169 lb (76.7 kg)   BMI 24.25 kg/m   Body mass index is 24.25 kg/m.  Advanced Directives 01/10/2018 01/03/2017 07/06/2016  Does Patient Have a Medical Advance Directive? Yes Yes No  Type of Paramedic of [place];Living will [place];Living will -  Copy of [place] in Chart? No - copy requested No - copy requested -  Would patient like information on creating a medical advance directive? - - No - patient declined information    Tobacco Social History   Tobacco Use  Smoking Status Former Smoker  . Years: 1.00  . Types: Cigarettes  Smokeless Tobacco Never Used  Tobacco Comment   >50 years ago in college     Counseling given: Not Answered Comment: >50 years ago in college   Clinical Intake:  Pre-visit preparation completed: Yes  Pain : No/denies pain Pain Score: 0-No pain     Nutritional Status: BMI of 19-24  Normal Nutritional Risks: None Diabetes: No  How often do you need to have someone help you when you read instructions, pamphlets, or other written materials from your doctor or pharmacy?: 1 - Never  Interpreter Needed?: No  Information entered by :: [facility], LPN  Past Medical History:  Diagnosis Date  . Anxiety   . Dysrhythmia   . GERD (gastroesophageal reflux disease)   . Hypertension   . Migraines   . Palpitations    Hx of them- identified as PVC's. did a holter monitor in 2/10 showing frequent PVC's. Additionally, the patient had an echcardiogram done in 2/10. EF 55%. there was some abnormal septal motion that may have  been due to frequent PVC's. there was no significant valvular disorder and the RV was normal in size and function.   Past Surgical History:  Procedure Laterality Date  . COLONOSCOPY WITH PROPOFOL N/A 07/06/2016   Procedure: COLONOSCOPY WITH PROPOFOL;  Surgeon: Manya Silvas, MD;  Location: [facility] ENDOSCOPY;  Service: Endoscopy;  Laterality: N/A;  . ESOPHAGOGASTRODUODENOSCOPY (EGD) WITH PROPOFOL N/A 07/06/2016   Procedure: ESOPHAGOGASTRODUODENOSCOPY (EGD) WITH PROPOFOL;  Surgeon: Manya Silvas, MD;  Location: [facility] ENDOSCOPY;  Service: Endoscopy;  Laterality: N/A;  . RETINAL DETACHMENT SURGERY  2008  . TONSILLECTOMY AND ADENOIDECTOMY  1950's  . VARICOSE VEIN SURGERY  2013   Family History  Problem Relation Age of Onset  . Hypertension Mother   . Dementia Mother   . Colon cancer Mother   . Kidney disease Father   . Hypertension Father   . Diabetes Father   . Dementia Father   . Colon cancer Sister   . Mitral valve prolapse Daughter   . Anxiety disorder Daughter   . Ulcerative colitis Son   . Heart attack Maternal Grandfather   . Colon cancer Paternal Grandmother   . CVA Paternal Grandfather   . Coronary artery disease Neg Hx    Social History   Socioeconomic History  . Marital status: Married    Spouse name: None  . Number of children: 2  . Years of education: None  .  Highest education level: Bachelor's degree (e.g., BA, AB, BS)  Social Needs  . Financial resource strain: Not hard at all  . Food insecurity - worry: Never true  . Food insecurity - inability: Never true  . Transportation needs - medical: No  . Transportation needs - non-medical: No  Occupational History  . Occupation: retired  Tobacco Use  . Smoking status: Former Smoker    Years: 1.00    Types: Cigarettes  . Smokeless tobacco: Never Used  . Tobacco comment: >50 years ago in college  Substance and Sexual Activity  . Alcohol use: No    Alcohol/week: 0.0 oz  . Drug use: No  . Sexual activity: None    Other Topics Concern  . None  Social History Narrative   Married and lives in [place] with wife. Works for [employer]     Outpatient Encounter Medications as of 01/10/2018  Medication Sig  . calcium carbonate (OS-CAL) 600 MG TABS tablet Take 600 mg by mouth daily with breakfast.  . Cholecalciferol (VITAMIN D) 2000 UNITS CAPS Take 200 Units by mouth daily.   Marland Kitchen LORazepam (ATIVAN) 0.5 MG tablet Take 1 tablet (0.5 mg total) by mouth 2 (two) times daily as needed for anxiety.  . Magnesium 250 MG TABS Take 250 mg by mouth daily.  Marland Kitchen omeprazole (PRILOSEC) 20 MG capsule Take 20 mg by mouth daily.   . vitamin B-12 (CYANOCOBALAMIN) 250 MCG tablet Take 250 mcg by mouth daily.   Marland Kitchen lisinopril (PRINIVIL,ZESTRIL) 5 MG tablet Take 1 tablet (5 mg total) by mouth daily. (Patient not taking: Reported on 01/10/2018)  . [DISCONTINUED] Cholecalciferol (VITAMIN D3) 400 units CAPS Take 1 capsule by mouth daily.  . [DISCONTINUED] magnesium citrate SOLN Take 296 mLs by mouth once.   . [DISCONTINUED] vitamin E 400 UNIT capsule Take by mouth.   No facility-administered encounter medications on file as of 01/10/2018.     Activities of Daily Living In your present state of health, do you have any difficulty performing the following activities: 01/10/2018  Hearing? N  Vision? N  Difficulty concentrating or making decisions? N  Walking or climbing stairs? N  Dressing or bathing? N  Doing errands, shopping? N  Preparing Food and eating ? N  Using the Toilet? N  In the past six months, have you accidently leaked urine? N  Do you have problems with loss of bowel control? N  Managing your Medications? N  Managing your Finances? N  Housekeeping or managing your Housekeeping? N  Some recent data might be hidden    Patient Care Team: Chrismon, Vickki Muff, PA as PCP - General (Physician Assistant) Birder Robson, MD as Referring Physician (Ophthalmology) Manya Silvas, MD as Consulting Physician  (Gastroenterology)   Assessment:   This is a routine wellness examination for [name].  Exercise Activities and Dietary recommendations Current Exercise Habits: Home exercise routine, Type of exercise: walking;Other - see comments;yoga;stretching;strength training/weights(swimming), Time (Minutes): 30(45 minutes), Frequency (Times/Week): 7, Weekly Exercise (Minutes/Week): 210, Intensity: Mild  Goals    . DIET - INCREASE WATER INTAKE     Recommend increasing water intake to 4-6 glasses a day.        Fall Risk Fall Risk  01/10/2018 01/03/2017 09/20/2015  Falls in the past year? No No No   Is the patient's home free of loose throw rugs in walkways, pet beds, electrical cords, etc?   yes      Grab bars in the bathroom? no  Handrails on the stairs?   yes      Adequate lighting?   yes  Timed Get Up and Go Performed: N/A  Depression Screen PHQ 2/9 Scores 01/10/2018 01/10/2018 01/03/2017 09/20/2015  PHQ - 2 Score 0 0 0 0  PHQ- 9 Score 0 - - -    Cognitive Function: Pt declined screening today.      6CIT Screen 01/03/2017  What Year? 0 points  What month? 0 points  What time? 0 points  Count back from 20 0 points  Months in reverse 0 points  Repeat phrase 2 points  Total Score 2    Immunization History  Administered Date(s) Administered  . Influenza, High Dose Seasonal PF 09/20/2015, 01/03/2017  . Pneumococcal Conjugate-13 05/07/2014  . Pneumococcal Polysaccharide-23 09/20/2015  . Tdap 07/04/2011    Qualifies for Shingles Vaccine? Due for Shingles vaccine. Declined my offer to administer today. Education has been provided regarding the importance of this vaccine. Pt has been advised to call her insurance company to determine her out of pocket expense. Advised she may also receive this vaccine at her local pharmacy or Health Dept. Verbalized acceptance and understanding.  Screening Tests Health Maintenance  Topic Date Due  . INFLUENZA VACCINE  07/04/2017  . TETANUS/TDAP   07/03/2021  . COLONOSCOPY  07/06/2026  . Hepatitis C Screening  Completed  . PNA vac Low Risk Adult  Completed   Cancer Screenings: Lung: Low Dose CT Chest recommended if Age 31-80 years, 30 pack-year currently smoking OR have quit w/in 15years. Patient does not qualify. Colorectal: Up to date  Additional Screenings:  Hepatitis B/HIV/Syphillis: Pt declines today.  Hepatitis C Screening: Up to date    Plan:  I have personally reviewed and addressed the Medicare Annual Wellness questionnaire and have noted the following in the patient's chart:  A. Medical and social history B. Use of alcohol, tobacco or illicit drugs  C. Current medications and supplements D. Functional ability and status E.  Nutritional status F.  Physical activity G. Advance directives H. List of other physicians I.  Hospitalizations, surgeries, and ER visits in previous 12 months J.  [place] such as hearing and vision if needed, cognitive and depression L. Referrals and appointments - none  In addition, I have reviewed and discussed with patient certain preventive protocols, quality metrics, and best practice recommendations. A written personalized care plan for preventive services as well as general preventive health recommendations were provided to patient.  See attached scanned questionnaire for additional information.   Signed,  Fabio Neighbors, LPN Nurse Health Advisor   Nurse Recommendations: Pt wanted the flu vaccine today, however we did not have it in stock. Pt will follow up on this at his next apt and would like to receive if in stock at that time.  Reviewed Nurse Health Advisor's documentation of screening and recommendations. Available for consultation and agree with recommendations/plans. Review with patient at next appointment.

## 2018-01-10 NOTE — Patient Instructions (Addendum)
Mike Spence , Thank you for taking time to come for your Medicare Wellness Visit. I appreciate your ongoing commitment to your health goals. Please review the following plan we discussed and let me know if I can assist you in the future.   Screening recommendations/referrals: Colonoscopy: Up to date Recommended yearly ophthalmology/optometry visit for glaucoma screening and checkup Recommended yearly dental visit for hygiene and checkup  Vaccinations: Influenza vaccine: Not available currently  Pneumococcal vaccine: Up to date Tdap vaccine: Up to date Shingles vaccine: Pt declines today.     Advanced directives: Please bring a copy of your POA (Power of Attorney) and/or Living Will to your next appointment.   Conditions/risks identified: Recommend increasing water intake to 4-6 glasses a day.   Next appointment: 01/17/17 @ 9:00 AM  Preventive Care 65 Years and Older, Male Preventive care refers to lifestyle choices and visits with your health care provider that can promote health and wellness. What does preventive care include?  A yearly physical exam. This is also called an annual well check.  Dental exams once or twice a year.  Routine eye exams. Ask your health care provider how often you should have your eyes checked.  Personal lifestyle choices, including:  Daily care of your teeth and gums.  Regular physical activity.  Eating a healthy diet.  Avoiding tobacco and drug use.  Limiting alcohol use.  Practicing safe sex.  Taking low doses of aspirin every day.  Taking vitamin and mineral supplements as recommended by your health care provider. What happens during an annual well check? The services and screenings done by your health care provider during your annual well check will depend on your age, overall health, lifestyle risk factors, and family history of disease. Counseling  Your health care provider may ask you questions about your:  Alcohol use.  Tobacco  use.  Drug use.  Emotional well-being.  Home and relationship well-being.  Sexual activity.  Eating habits.  History of falls.  Memory and ability to understand (cognition).  Work and work Statistician. Screening  You may have the following tests or measurements:  Height, weight, and BMI.  Blood pressure.  Lipid and cholesterol levels. These may be checked every 5 years, or more frequently if you are over 74 years old.  Skin check.  Lung cancer screening. You may have this screening every year starting at age 74 if you have a 30-pack-year history of smoking and currently smoke or have quit within the past 15 years.  Fecal occult blood test (FOBT) of the stool. You may have this test every year starting at age 74.  Flexible sigmoidoscopy or colonoscopy. You may have a sigmoidoscopy every 5 years or a colonoscopy every 10 years starting at age 74.  Prostate cancer screening. Recommendations will vary depending on your family history and other risks.  Hepatitis C blood test.  Hepatitis B blood test.  Sexually transmitted disease (STD) testing.  Diabetes screening. This is done by checking your blood sugar (glucose) after you have not eaten for a while (fasting). You may have this done every 1-3 years.  Abdominal aortic aneurysm (AAA) screening. You may need this if you are a current or former smoker.  Osteoporosis. You may be screened starting at age 74 if you are at high risk. Talk with your health care provider about your test results, treatment options, and if necessary, the need for more tests. Vaccines  Your health care provider may recommend certain vaccines, such as:  Influenza vaccine.  This is recommended every year.  Tetanus, diphtheria, and acellular pertussis (Tdap, Td) vaccine. You may need a Td booster every 10 years.  Zoster vaccine. You may need this after age 74.  Pneumococcal 13-valent conjugate (PCV13) vaccine. One dose is recommended after age  74.  Pneumococcal polysaccharide (PPSV23) vaccine. One dose is recommended after age 74. Talk to your health care provider about which screenings and vaccines you need and how often you need them. This information is not intended to replace advice given to you by your health care provider. Make sure you discuss any questions you have with your health care provider. Document Released: 12/17/2015 Document Revised: 08/09/2016 Document Reviewed: 09/21/2015 Elsevier Interactive Patient Education  2017 [employer] Prevention in the Home Falls can cause injuries. They can happen to people of all ages. There are many things you can do to make your home safe and to help prevent falls. What can I do on the outside of my home?  Regularly fix the edges of walkways and driveways and fix any cracks.  Remove anything that might make you trip as you walk through a door, such as a raised step or threshold.  Trim any bushes or trees on the path to your home.  Use bright outdoor lighting.  Clear any walking paths of anything that might make someone trip, such as rocks or tools.  Regularly check to see if handrails are loose or broken. Make sure that both sides of any steps have handrails.  Any raised decks and porches should have guardrails on the edges.  Have any leaves, snow, or ice cleared regularly.  Use sand or salt on walking paths during winter.  Clean up any spills in your garage right away. This includes oil or grease spills. What can I do in the bathroom?  Use night lights.  Install grab bars by the toilet and in the tub and shower. Do not use towel bars as grab bars.  Use non-skid mats or decals in the tub or shower.  If you need to sit down in the shower, use a plastic, non-slip stool.  Keep the floor dry. Clean up any water that spills on the floor as soon as it happens.  Remove soap buildup in the tub or shower regularly.  Attach bath mats securely with double-sided  non-slip rug tape.  Do not have throw rugs and other things on the floor that can make you trip. What can I do in the bedroom?  Use night lights.  Make sure that you have a light by your bed that is easy to reach.  Do not use any sheets or blankets that are too big for your bed. They should not hang down onto the floor.  Have a firm chair that has side arms. You can use this for support while you get dressed.  Do not have throw rugs and other things on the floor that can make you trip. What can I do in the kitchen?  Clean up any spills right away.  Avoid walking on wet floors.  Keep items that you use a lot in easy-to-reach places.  If you need to reach something above you, use a strong step stool that has a grab bar.  Keep electrical cords out of the way.  Do not use floor polish or wax that makes floors slippery. If you must use wax, use non-skid floor wax.  Do not have throw rugs and other things on the floor that can make  you trip. What can I do with my stairs?  Do not leave any items on the stairs.  Make sure that there are handrails on both sides of the stairs and use them. Fix handrails that are broken or loose. Make sure that handrails are as long as the stairways.  Check any carpeting to make sure that it is firmly attached to the stairs. Fix any carpet that is loose or worn.  Avoid having throw rugs at the top or bottom of the stairs. If you do have throw rugs, attach them to the floor with carpet tape.  Make sure that you have a light switch at the top of the stairs and the bottom of the stairs. If you do not have them, ask someone to add them for you. What else can I do to help prevent falls?  Wear shoes that:  Do not have high heels.  Have rubber bottoms.  Are comfortable and fit you well.  Are closed at the toe. Do not wear sandals.  If you use a stepladder:  Make sure that it is fully opened. Do not climb a closed stepladder.  Make sure that both  sides of the stepladder are locked into place.  Ask someone to hold it for you, if possible.  Clearly mark and make sure that you can see:  Any grab bars or handrails.  First and last steps.  Where the edge of each step is.  Use tools that help you move around (mobility aids) if they are needed. These include:  Canes.  Walkers.  Scooters.  Crutches.  Turn on the lights when you go into a dark area. Replace any light bulbs as soon as they burn out.  Set up your furniture so you have a clear path. Avoid moving your furniture around.  If any of your floors are uneven, fix them.  If there are any pets around you, be aware of where they are.  Review your medicines with your doctor. Some medicines can make you feel dizzy. This can increase your chance of falling. Ask your doctor what other things that you can do to help prevent falls. This information is not intended to replace advice given to you by your health care provider. Make sure you discuss any questions you have with your health care provider. Document Released: 09/16/2009 Document Revised: 04/27/2016 Document Reviewed: 12/25/2014 Elsevier Interactive Patient Education  2017 [employer].

## 2018-01-17 ENCOUNTER — Encounter: Payer: Self-pay | Admitting: Family Medicine

## 2018-01-17 ENCOUNTER — Ambulatory Visit (INDEPENDENT_AMBULATORY_CARE_PROVIDER_SITE_OTHER): Payer: PPO | Admitting: Family Medicine

## 2018-01-17 VITALS — BP 128/88 | HR 78 | Temp 97.6°F | Ht 70.0 in | Wt 167.2 lb

## 2018-01-17 DIAGNOSIS — F411 Generalized anxiety disorder: Secondary | ICD-10-CM

## 2018-01-17 DIAGNOSIS — R972 Elevated prostate specific antigen [PSA]: Secondary | ICD-10-CM

## 2018-01-17 DIAGNOSIS — K22710 Barrett's esophagus with low grade dysplasia: Secondary | ICD-10-CM

## 2018-01-17 DIAGNOSIS — I8393 Asymptomatic varicose veins of bilateral lower extremities: Secondary | ICD-10-CM | POA: Diagnosis not present

## 2018-01-17 DIAGNOSIS — Z23 Encounter for immunization: Secondary | ICD-10-CM | POA: Diagnosis not present

## 2018-01-17 DIAGNOSIS — R5381 Other malaise: Secondary | ICD-10-CM

## 2018-01-17 DIAGNOSIS — I10 Essential (primary) hypertension: Secondary | ICD-10-CM | POA: Diagnosis not present

## 2018-01-17 NOTE — Progress Notes (Signed)
Patient: Mike Spence, Male    DOB: [birth year], 72 y.o.   MRN: [id number] Visit Date: 01/17/2018  Today's Provider: [name], PA   Chief Complaint  Patient presents with  . Annual Exam   Subjective:     Complete Physical Mike Spence is a 72 y.o. male. He feels well. He reports exercising daily. He reports he is sleeping well. Patient denies any issues today.   ----------------------------------------------------------- AWE was on 01/10/2018  Review of Systems  Constitutional: Negative.   HENT: Negative.   Eyes: Negative.   Respiratory: Negative.   Cardiovascular: Negative.   Gastrointestinal: Negative.   Endocrine: Negative.   Genitourinary: Negative.   Musculoskeletal: Negative.   Skin: Negative.   Allergic/Immunologic: Negative.   Neurological: Negative.   Hematological: Negative.   Psychiatric/Behavioral: Negative.     Social History   Socioeconomic History  . Marital status: Married    Spouse name: Not on file  . Number of children: 2  . Years of education: Not on file  . Highest education level: Bachelor's degree (e.g., BA, AB, BS)  Social Needs  . Financial resource strain: Not hard at all  . Food insecurity - worry: Never true  . Food insecurity - inability: Never true  . Transportation needs - medical: No  . Transportation needs - non-medical: No  Occupational History  . Occupation: retired  Tobacco Use  . Smoking status: Former Smoker    Years: 1.00    Types: Cigarettes  . Smokeless tobacco: Never Used  . Tobacco comment: >50 years ago in college  Substance and Sexual Activity  . Alcohol use: No    Alcohol/week: 0.0 oz  . Drug use: No  . Sexual activity: Not on file  Other Topics Concern  . Not on file  Social History Narrative   Married and lives in [place] with wife. Works for Doctor, general practice.     Past Medical History:  Diagnosis Date  . Anxiety   . Dysrhythmia   . GERD (gastroesophageal reflux disease)   .  Hypertension   . Migraines   . Palpitations    Hx of them- identified as PVC's. did a holter monitor in 2/10 showing frequent PVC's. Additionally, the patient had an echcardiogram done in 2/10. EF 55%. there was some abnormal septal motion that may have been due to frequent PVC's. there was no significant valvular disorder and the RV was normal in size and function.     Patient Active Problem List   Diagnosis Date Noted  . History of Barrett's esophagus 09/28/2017  . Phlebectasia 09/20/2015  . Acid reflux 09/20/2015  . Anxiety, generalized 09/15/2015  . Personal history of tobacco use, presenting hazards to health 09/15/2015  . Anterior optic neuritis 09/15/2015  . HYPERTENSION, BENIGN 10/26/2010  . PALPITATIONS 10/26/2010  . Barrett esophagus 06/16/2008  . Leg varices 06/04/2007  . Diaphragmatic hernia 06/01/2006  . Esophagitis, reflux 06/01/2006  . Essential (primary) hypertension 06/01/2006    Past Surgical History:  Procedure Laterality Date  . COLONOSCOPY WITH PROPOFOL N/A 07/06/2016   Procedure: COLONOSCOPY WITH PROPOFOL;  Surgeon: Manya Silvas, MD;  Location: [facility] ENDOSCOPY;  Service: Endoscopy;  Laterality: N/A;  . ESOPHAGOGASTRODUODENOSCOPY (EGD) WITH PROPOFOL N/A 07/06/2016   Procedure: ESOPHAGOGASTRODUODENOSCOPY (EGD) WITH PROPOFOL;  Surgeon: Manya Silvas, MD;  Location: [facility] ENDOSCOPY;  Service: Endoscopy;  Laterality: N/A;  . RETINAL DETACHMENT SURGERY  2008  . TONSILLECTOMY AND ADENOIDECTOMY  1950's  . [place] SURGERY  2013  His family history includes Anxiety disorder in his daughter; CVA in his paternal grandfather; Colon cancer in his mother, paternal grandmother, and sister; Dementia in his father and mother; Diabetes in his father; Heart attack in his maternal grandfather; Hypertension in his father and mother; Kidney disease in his father; Mitral valve prolapse in his daughter; Ulcerative colitis in his son. There is no history of Coronary artery  disease.      Current Outpatient Medications:  .  calcium carbonate (OS-CAL) 600 MG TABS tablet, Take 600 mg by mouth daily with breakfast., Disp: , Rfl:  .  Cholecalciferol (VITAMIN D) 2000 UNITS CAPS, Take 200 Units by mouth daily. , Disp: , Rfl:  .  LORazepam (ATIVAN) 0.5 MG tablet, Take 1 tablet (0.5 mg total) by mouth 2 (two) times daily as needed for anxiety., Disp: 30 tablet, Rfl: 1 .  Magnesium 250 MG TABS, Take 250 mg by mouth daily., Disp: , Rfl:  .  omeprazole (PRILOSEC) 20 MG capsule, Take 20 mg by mouth daily. , Disp: , Rfl:  .  vitamin B-12 (CYANOCOBALAMIN) 250 MCG tablet, Take 250 mcg by mouth daily. , Disp: , Rfl:  .  lisinopril (PRINIVIL,ZESTRIL) 5 MG tablet, Take 1 tablet (5 mg total) by mouth daily. (Patient not taking: Reported on 01/10/2018), Disp: 30 tablet, Rfl: 3  Patient Care Team: Celvin Taney, Vickki Muff, PA as PCP - General (Physician Assistant) Birder Robson, MD as Referring Physician (Ophthalmology) Manya Silvas, MD as Consulting Physician (Gastroenterology)     Objective:   Vitals: BP 128/88 (BP Location: Right Arm, Patient Position: Sitting, Cuff Size: Normal)   Pulse 78   Temp 97.6 F (36.4 C) (Oral)   Ht 5\' 10"  (1.778 m)   Wt 167 lb 3.2 oz (75.8 kg)   SpO2 99%   BMI 23.99 kg/m   Physical Exam  Constitutional: He is oriented to person, place, and time. He appears well-developed and well-nourished.  HENT:  Head: Normocephalic and atraumatic.  Right Ear: External ear normal.  Left Ear: External ear normal.  Nose: Nose normal.  Mouth/Throat: Oropharynx is clear and moist.  Eyes: Conjunctivae and EOM are normal. Pupils are equal, round, and reactive to light. Right eye exhibits no discharge.  Neck: Normal range of motion. Neck supple. No tracheal deviation present. No thyromegaly present.  Cardiovascular: Normal rate, regular rhythm, normal heart sounds and intact distal pulses.  No murmur heard. Pulmonary/Chest: Effort normal and breath sounds  normal. No respiratory distress. He has no wheezes. He has no rales. He exhibits no tenderness.  Abdominal: Soft. He exhibits no distension and no mass. There is no tenderness. There is no rebound and no guarding.  Musculoskeletal: Normal range of motion. He exhibits no edema or tenderness.  Lymphadenopathy:    He has no cervical adenopathy.  Neurological: He is alert and oriented to person, place, and time. He has normal reflexes. No cranial nerve deficit. He exhibits normal muscle tone. Coordination normal.  Skin: Skin is warm and dry. No rash noted. No erythema.  Psychiatric: He has a normal mood and affect. His behavior is normal. Judgment and thought content normal.    Activities of Daily Living In your present state of health, do you have any difficulty performing the following activities: 01/10/2018  Hearing? N  Vision? N  Difficulty concentrating or making decisions? N  Walking or climbing stairs? N  Dressing or bathing? N  Doing errands, shopping? N  Preparing Food and eating ? N  Using the Toilet?  N  In the past six months, have you accidently leaked urine? N  Do you have problems with loss of bowel control? N  Managing your Medications? N  Managing your Finances? N  Housekeeping or managing your Housekeeping? N  Some recent data might be hidden    Fall Risk Assessment Fall Risk  01/10/2018 01/03/2017 09/20/2015  Falls in the past year? No No No     Depression Screen PHQ 2/9 Scores 01/10/2018 01/10/2018 01/03/2017 09/20/2015  PHQ - 2 Score 0 0 0 0  PHQ- 9 Score 0 - - -     Assessment & Plan:    Annual Physical Reviewed patient's Family Medical History Reviewed and updated list of patient's medical providers Assessment of cognitive impairment was done Assessed patient's functional ability Established a written schedule for health screening [place] Completed and Reviewed  Exercise Activities and Dietary recommendations Goals    . DIET -  INCREASE WATER INTAKE     Recommend increasing water intake to 4-6 glasses a day.        Immunization History  Administered Date(s) Administered  . Influenza, High Dose Seasonal PF 09/20/2015, 01/03/2017, 01/17/2018  . Pneumococcal Conjugate-13 05/07/2014  . Pneumococcal Polysaccharide-23 09/20/2015  . Tdap 07/04/2011    Health Maintenance  Topic Date Due  . Samul Dada  07/03/2021  . COLONOSCOPY  07/06/2026  . INFLUENZA VACCINE  Completed  . Hepatitis C Screening  Completed  . PNA vac Low Risk Adult  Completed     Discussed health benefits of physical activity, and encouraged him to engage in regular exercise appropriate for his age and condition.    ------------------------------------------------------------------------------------------------------------ 1. Malaise Intermittent malaise with some headaches twice a month. Trying to restrict gluten in diet. States daughter has Celiac Disease and he is not sure if he may have the tendency for it. States gastroenterologist (Dr. Vira Agar) has not seen any signs of it in the past. Check CBC and CMP to rule out infection, anemia or metabolic disorder. - CBC with Differential/Platelet - Comprehensive metabolic panel  2. Essential (primary) hypertension Stable with better reading today than in the past. Still gets elevation with "White Coat Syndrome" and anxiety. Continues to use Ativan prn significant anxiety and intermittent use of Lisinopril 5 mg. Recheck labs and follow up pending reports. - CBC with Differential/Platelet - Comprehensive metabolic panel - Lipid panel - TSH  3. Barrett's esophagus with low grade dysplasia Followed by Dr. Vira Agar (gastroenterologist). Last upper endoscopy and colonoscopy showed gastric polyps, a short segment of Barrett's and 7 colon polyps resected 07-06-16. Dyspepsia controlled by Omeprazole 20 mg qd prn. Recheck CBC. Stool negative for occult blood today. - CBC with Differential/Platelet  4.  Anxiety, generalized Triggered by performance demands. Continues to have good response from regular exercise (swimming and running daily). Check routine labs for metabolic disorder. Having some headaches for 12-24 hours twice a month around eyes and nose.  - CBC with Differential/Platelet - TSH  5. Asymptomatic varicose veins of both lower extremities Many large varicosities in the right leg - smaller in the left leg. Asymptomatic without ulcerations or edema.  6. Elevated PSA PSA was 4.1 in 2018 and 2.5 in 2017. Essentially asymptomatic. Will check for further elevation. - PSA  7. Need for influenza vaccination - Flu vaccine HIGH DOSE PF    [name], PA  [facility]

## 2018-01-21 DIAGNOSIS — K22710 Barrett's esophagus with low grade dysplasia: Secondary | ICD-10-CM | POA: Diagnosis not present

## 2018-01-21 DIAGNOSIS — F411 Generalized anxiety disorder: Secondary | ICD-10-CM | POA: Diagnosis not present

## 2018-01-21 DIAGNOSIS — I10 Essential (primary) hypertension: Secondary | ICD-10-CM | POA: Diagnosis not present

## 2018-01-21 DIAGNOSIS — R5381 Other malaise: Secondary | ICD-10-CM | POA: Diagnosis not present

## 2018-01-21 DIAGNOSIS — R972 Elevated prostate specific antigen [PSA]: Secondary | ICD-10-CM | POA: Diagnosis not present

## 2018-01-22 ENCOUNTER — Telehealth: Payer: Self-pay

## 2018-01-22 DIAGNOSIS — R972 Elevated prostate specific antigen [PSA]: Secondary | ICD-10-CM

## 2018-01-22 LAB — CBC WITH DIFFERENTIAL/PLATELET
BASOS: 1 %
Basophils Absolute: 0 10*3/uL (ref 0.0–0.2)
EOS (ABSOLUTE): 0.1 10*3/uL (ref 0.0–0.4)
Eos: 1 %
Hematocrit: 39.9 % (ref 37.5–51.0)
Hemoglobin: 13.3 g/dL (ref 13.0–17.7)
IMMATURE GRANULOCYTES: 0 %
Immature Grans (Abs): 0 10*3/uL (ref 0.0–0.1)
Lymphocytes Absolute: 3 10*3/uL (ref 0.7–3.1)
Lymphs: 44 %
MCH: 32.2 pg (ref 26.6–33.0)
MCHC: 33.3 g/dL (ref 31.5–35.7)
MCV: 97 fL (ref 79–97)
MONOS ABS: 0.5 10*3/uL (ref 0.1–0.9)
Monocytes: 8 %
Neutrophils Absolute: 3.2 10*3/uL (ref 1.4–7.0)
Neutrophils: 46 %
PLATELETS: 226 10*3/uL (ref 150–379)
RBC: 4.13 x10E6/uL — ABNORMAL LOW (ref 4.14–5.80)
RDW: 13.3 % (ref 12.3–15.4)
WBC: 6.9 10*3/uL (ref 3.4–10.8)

## 2018-01-22 LAB — COMPREHENSIVE METABOLIC PANEL
ALK PHOS: 57 IU/L (ref 39–117)
ALT: 17 IU/L (ref 0–44)
AST: 16 IU/L (ref 0–40)
Albumin/Globulin Ratio: 1.6 (ref 1.2–2.2)
Albumin: 4.2 g/dL (ref 3.5–4.8)
BILIRUBIN TOTAL: 0.5 mg/dL (ref 0.0–1.2)
BUN/Creatinine Ratio: 19 (ref 10–24)
BUN: 26 mg/dL (ref 8–27)
CALCIUM: 9.5 mg/dL (ref 8.6–10.2)
CHLORIDE: 102 mmol/L (ref 96–106)
CO2: 24 mmol/L (ref 20–29)
Creatinine, Ser: 1.38 mg/dL — ABNORMAL HIGH (ref 0.76–1.27)
GFR calc Af Amer: 58 mL/min/{1.73_m2} — ABNORMAL LOW (ref >59)
GFR calc non Af Amer: 50 mL/min/{1.73_m2} — ABNORMAL LOW (ref >59)
Globulin, Total: 2.7 g/dL (ref 1.5–4.5)
Glucose: 96 mg/dL (ref 65–99)
Potassium: 4.4 mmol/L (ref 3.5–5.2)
Sodium: 142 mmol/L (ref 134–144)
Total Protein: 6.9 g/dL (ref 6.0–8.5)

## 2018-01-22 LAB — TSH: TSH: 2.78 u[IU]/mL (ref 0.450–4.500)

## 2018-01-22 LAB — LIPID PANEL
CHOLESTEROL TOTAL: 216 mg/dL — AB (ref 100–199)
Chol/HDL Ratio: 2.8 ratio (ref 0.0–5.0)
HDL: 77 mg/dL (ref >39)
LDL Calculated: 128 mg/dL — ABNORMAL HIGH (ref 0–99)
TRIGLYCERIDES: 55 mg/dL (ref 0–149)
VLDL Cholesterol Cal: 11 mg/dL (ref 5–40)

## 2018-01-22 LAB — PSA: Prostate Specific Ag, Serum: 5.1 ng/mL — ABNORMAL HIGH (ref 0.0–4.0)

## 2018-01-22 NOTE — Telephone Encounter (Signed)
Recommend we set referral according to who your insurance covers best. Place referral order for Mike Spence due to patient having elevated PSA and elevated creatinine level. Once referral order is placed, patient can set day and time of appointment with the urology office.

## 2018-01-22 NOTE — Telephone Encounter (Signed)
Patient advised he wants to know which urologist you would recommend? He states that he would call them and make appt. KW

## 2018-01-22 NOTE — Telephone Encounter (Signed)
-----   Message from Margo Common, [place] sent at 01/22/2018  7:54 AM EST ----- Kidney function shows increased stress with increase in creatinine. PSA higher than a year ago. Could be caused by prostate enlargement. Probably need evaluation by urologist.

## 2018-01-22 NOTE — Telephone Encounter (Signed)
Order for referral put in chart. KW

## 2018-02-14 ENCOUNTER — Encounter: Payer: Self-pay | Admitting: Urology

## 2018-02-14 ENCOUNTER — Ambulatory Visit: Payer: PPO | Admitting: Urology

## 2018-02-14 VITALS — BP 155/81 | HR 112 | Ht 70.0 in | Wt 160.0 lb

## 2018-02-14 DIAGNOSIS — R972 Elevated prostate specific antigen [PSA]: Secondary | ICD-10-CM | POA: Diagnosis not present

## 2018-02-14 LAB — URINALYSIS, COMPLETE
BILIRUBIN UA: NEGATIVE
Glucose, UA: NEGATIVE
Ketones, UA: NEGATIVE
Leukocytes, UA: NEGATIVE
Nitrite, UA: NEGATIVE
PH UA: 6.5 (ref 5.0–7.5)
PROTEIN UA: NEGATIVE
Specific Gravity, UA: 1.015 (ref 1.005–1.030)
UUROB: 0.2 mg/dL (ref 0.2–1.0)

## 2018-02-14 LAB — MICROSCOPIC EXAMINATION
BACTERIA UA: NONE SEEN
Epithelial Cells (non renal): NONE SEEN /hpf (ref 0–10)

## 2018-02-18 ENCOUNTER — Encounter: Payer: Self-pay | Admitting: Urology

## 2018-02-18 ENCOUNTER — Other Ambulatory Visit: Payer: PPO

## 2018-02-18 DIAGNOSIS — R972 Elevated prostate specific antigen [PSA]: Secondary | ICD-10-CM | POA: Diagnosis not present

## 2018-02-18 NOTE — Progress Notes (Signed)
02/14/2018 12:17 PM   Mike Spence July 23, 1946 [id number]  Referring provider: Margo Common, [place], [place] [address]  Chief Complaint  Patient presents with  . Elevated PSA    New Patient    HPI: Mike Spence is a 72 year old male seen in consultation at the request of [name] for evaluation of an elevated PSA.  A PSA drawn February 2019 was mildly elevated at 5.1.  A PSA in June 2015 was 2.5; February 2018 4.1 and February 2019 5.1.  He has mild lower urinary tract symptoms consisting of nocturia x1-2 and intermittent urinary stream.  His voiding symptoms are not bothersome.  He denies dysuria or gross hematuria.  He has no flank, abdominal, pelvic or scrotal pain.  He was also found to have a slightly elevated creatinine at 1.38 however no further evaluation was recommended.  PMH: Past Medical History:  Diagnosis Date  . Anxiety   . Dysrhythmia   . GERD (gastroesophageal reflux disease)   . Hypertension   . Migraines   . Palpitations    Hx of them- identified as PVC's. did a holter monitor in 2/10 showing frequent PVC's. Additionally, the patient had an echcardiogram done in 2/10. EF 55%. there was some abnormal septal motion that may have been due to frequent PVC's. there was no significant valvular disorder and the RV was normal in size and function.    Surgical History: Past Surgical History:  Procedure Laterality Date  . COLONOSCOPY WITH PROPOFOL N/A 07/06/2016   Procedure: COLONOSCOPY WITH PROPOFOL;  Surgeon: Manya Silvas, MD;  Location: [facility] ENDOSCOPY;  Service: Endoscopy;  Laterality: N/A;  . ESOPHAGOGASTRODUODENOSCOPY (EGD) WITH PROPOFOL N/A 07/06/2016   Procedure: ESOPHAGOGASTRODUODENOSCOPY (EGD) WITH PROPOFOL;  Surgeon: Manya Silvas, MD;  Location: [facility] ENDOSCOPY;  Service: Endoscopy;  Laterality: N/A;  . RETINAL DETACHMENT SURGERY  2008  . TONSILLECTOMY AND ADENOIDECTOMY  1950's  . VARICOSE VEIN SURGERY  2013    Home  Medications:  Allergies as of 02/14/2018      Reactions   Aspirin    REACTION: upsets stomach   Gluten Meal Other (See Comments)      Medication List        Accurate as of 02/14/18 11:59 PM. Always use your most recent med list.          calcium carbonate 600 MG Tabs tablet Commonly known as:  OS-CAL Take 600 mg by mouth daily with breakfast.   lisinopril 5 MG tablet Commonly known as:  PRINIVIL,ZESTRIL Take 1 tablet (5 mg total) by mouth daily.   LORazepam 0.5 MG tablet Commonly known as:  ATIVAN Take 1 tablet (0.5 mg total) by mouth 2 (two) times daily as needed for anxiety.   Magnesium 250 MG Tabs Take 250 mg by mouth daily.   omeprazole 20 MG capsule Commonly known as:  PRILOSEC Take 20 mg by mouth daily.   vitamin B-12 250 MCG tablet Commonly known as:  CYANOCOBALAMIN Take 250 mcg by mouth daily.   Vitamin D 2000 units Caps Take 200 Units by mouth daily.       Allergies:  Allergies  Allergen Reactions  . Aspirin     REACTION: upsets stomach  . Gluten Meal Other (See Comments)    Family History: Family History  Problem Relation Age of Onset  . Hypertension Mother   . Dementia Mother   . Colon cancer Mother   . Kidney disease Father   . Hypertension Father   .  Diabetes Father   . Dementia Father   . Colon cancer Sister   . Mitral valve prolapse Daughter   . Anxiety disorder Daughter   . Ulcerative colitis Son   . Heart attack Maternal Grandfather   . Colon cancer Paternal Grandmother   . CVA Paternal Grandfather   . Coronary artery disease Neg Hx     Social History:  reports that he has quit smoking. His smoking use included cigarettes. He quit after 1.00 year of use. he has never used smokeless tobacco. He reports that he does not drink alcohol or use drugs.  ROS: UROLOGY Frequent Urination?: Yes Hard to postpone urination?: No Burning/pain with urination?: No Get up at night to urinate?: Yes Leakage of urine?: No Urine stream starts  and stops?: Yes Trouble starting stream?: No Do you have to strain to urinate?: No Blood in urine?: No Urinary tract infection?: No Sexually transmitted disease?: No Injury to kidneys or bladder?: No Painful intercourse?: No Weak stream?: Yes Erection problems?: No Penile pain?: No  Gastrointestinal Nausea?: No Vomiting?: No Indigestion/heartburn?: No Diarrhea?: No Constipation?: No  Constitutional Fever: No Night sweats?: No Weight loss?: No Fatigue?: No  Skin Skin rash/lesions?: No Itching?: No  Eyes Blurred vision?: No Double vision?: No  Ears/Nose/Throat Sore throat?: No Sinus problems?: No  Hematologic/Lymphatic Swollen glands?: No Easy bruising?: No  Cardiovascular Leg swelling?: No Chest pain?: No  Respiratory Cough?: No Shortness of breath?: No  Endocrine Excessive thirst?: No  Musculoskeletal Back pain?: No Joint pain?: No  Neurological Headaches?: Yes Dizziness?: No  Psychologic Depression?: No Anxiety?: No  Physical Exam: BP (!) 155/81   Pulse (!) 112   Ht 5\' 10"  (1.778 m)   Wt 160 lb (72.6 kg)   BMI 22.96 kg/m   Constitutional:  Alert and oriented, No acute distress. HEENT: [place] AT, moist mucus membranes.  Trachea midline, no masses. Cardiovascular: No clubbing, cyanosis, or edema. Respiratory: Normal respiratory effort, no increased work of breathing. GI: Abdomen is soft, nontender, nondistended, no abdominal masses GU: No CVA tenderness.  Penis without lesions, testes descended bilaterally.  Mild left testis atrophy with a moderate left varicocele.  Prostate 50 g, smooth without nodules. Lymph: No cervical or inguinal lymphadenopathy. Skin: No rashes, bruises or suspicious lesions. Neurologic: Grossly intact, no focal deficits, moving all 4 extremities. Psychiatric: Normal mood and affect.  Laboratory Data: Lab Results  Component Value Date   WBC 6.9 01/21/2018   HGB 13.3 01/21/2018   HCT 39.9 01/21/2018   MCV 97  01/21/2018   PLT 226 01/21/2018    Lab Results  Component Value Date   CREATININE 1.38 (H) 01/21/2018    Lab Results  Component Value Date   PSA 2.5 05/13/2014     Urinalysis Dipstick: 1+ blood Microscopy: Negative  Pertinent Imaging: N/A  Assessment & Plan:   72 year old male with a mildly elevated PSA.  Although PSA is a prostate cancer screening test he was informed that cancer is not the most common cause of an elevated PSA. Other potential causes including BPH and inflammation were discussed. He was informed that the only way to adequately diagnose prostate cancer would be a transrectal ultrasound and biopsy of the prostate. The procedure was discussed including potential risks of bleeding and infection/sepsis. He was also informed that a negative biopsy does not conclusively rule out the possibility that prostate cancer may be present and that continued monitoring is required. The use of newer adjunctive blood tests including PHI and 4kScore were  discussed. The use of multiparametric prostate MRI was also discussed however is not typically used for initial evaluation of an elevated PSA. Continued periodic surveillance was also discussed.  He has initially elected surveillance and will return May 2019 for a 4Kscore.   - Urinalysis, Complete    Abbie Sons, MD  [facility] [address] [place],  [address] [phone]

## 2018-02-19 LAB — CREATININE, SERUM
CREATININE: 1.12 mg/dL (ref 0.76–1.27)
GFR calc Af Amer: 75 mL/min/{1.73_m2} (ref >59)
GFR, EST NON AFRICAN AMERICAN: 65 mL/min/{1.73_m2} (ref >59)

## 2018-02-20 ENCOUNTER — Telehealth: Payer: Self-pay

## 2018-02-20 NOTE — Telephone Encounter (Signed)
Letter sent.

## 2018-02-20 NOTE — Telephone Encounter (Signed)
-----   Message from Abbie Sons, MD sent at 02/19/2018  8:35 AM EDT ----- Repeat creatinine normal at 1.12

## 2018-04-16 ENCOUNTER — Ambulatory Visit (INDEPENDENT_AMBULATORY_CARE_PROVIDER_SITE_OTHER): Payer: PPO | Admitting: Family Medicine

## 2018-04-16 DIAGNOSIS — R972 Elevated prostate specific antigen [PSA]: Secondary | ICD-10-CM

## 2018-04-16 NOTE — Progress Notes (Signed)
Patient presents today for 4 K score. Blood was drawn and sent by [employer]

## 2018-04-17 DIAGNOSIS — R972 Elevated prostate specific antigen [PSA]: Secondary | ICD-10-CM | POA: Diagnosis not present

## 2018-04-22 ENCOUNTER — Other Ambulatory Visit: Payer: Self-pay | Admitting: Urology

## 2018-04-30 ENCOUNTER — Telehealth: Payer: Self-pay | Admitting: Family Medicine

## 2018-04-30 NOTE — Telephone Encounter (Signed)
LMOM for patient to return call.

## 2018-04-30 NOTE — Telephone Encounter (Signed)
-----   Message from Abbie Sons, MD sent at 04/30/2018  8:15 AM EDT ----- 4K showed a 56% probability of having a high risk prostate cancer.  Based on these results would recommend scheduling prostate biopsy.

## 2018-05-01 NOTE — Telephone Encounter (Signed)
Patient notified and appointment was scheduled to discuss having a Prostate Biopsy.

## 2018-05-01 NOTE — Telephone Encounter (Signed)
Pt called back twice returning call.  Please give him a call.

## 2018-05-08 ENCOUNTER — Ambulatory Visit: Payer: PPO | Admitting: Urology

## 2018-05-08 ENCOUNTER — Encounter: Payer: Self-pay | Admitting: Urology

## 2018-05-08 VITALS — BP 115/77 | HR 123 | Ht 70.0 in | Wt 164.2 lb

## 2018-05-08 DIAGNOSIS — R972 Elevated prostate specific antigen [PSA]: Secondary | ICD-10-CM | POA: Diagnosis not present

## 2018-05-08 NOTE — Progress Notes (Signed)
05/08/2018 10:13 AM   Mike Spence [birth year] [id number]  Referring provider: Margo Common, [place], [place] [address]  Chief Complaint  Patient presents with  . Results    HPI: 72 year old male with an elevated PSA presents for prostate biopsy discussion.  I saw him in March 2019 for a rise in his PSA to 5.1.  He elected surveillance with a follow-up 4Kscore.  This was drawn on 04/19/2018 and the PSA was 4.86 with a 4K showing a 56% probability of high-grade prostate cancer if a biopsy were to be performed.   PMH: Past Medical History:  Diagnosis Date  . Anxiety   . Dysrhythmia   . GERD (gastroesophageal reflux disease)   . Hypertension   . Migraines   . Palpitations    Hx of them- identified as PVC's. did a holter monitor in 2/10 showing frequent PVC's. Additionally, the patient had an echcardiogram done in 2/10. EF 55%. there was some abnormal septal motion that may have been due to frequent PVC's. there was no significant valvular disorder and the RV was normal in size and function.    Surgical History: Past Surgical History:  Procedure Laterality Date  . COLONOSCOPY WITH PROPOFOL N/A 07/06/2016   Procedure: COLONOSCOPY WITH PROPOFOL;  Surgeon: Manya Silvas, MD;  Location: [facility] ENDOSCOPY;  Service: Endoscopy;  Laterality: N/A;  . ESOPHAGOGASTRODUODENOSCOPY (EGD) WITH PROPOFOL N/A 07/06/2016   Procedure: ESOPHAGOGASTRODUODENOSCOPY (EGD) WITH PROPOFOL;  Surgeon: Manya Silvas, MD;  Location: [facility] ENDOSCOPY;  Service: Endoscopy;  Laterality: N/A;  . RETINAL DETACHMENT SURGERY  2008  . TONSILLECTOMY AND ADENOIDECTOMY  1950's  . VARICOSE VEIN SURGERY  2013    Home Medications:  Allergies as of 05/08/2018      Reactions   Aspirin    REACTION: upsets stomach   Gluten Meal Other (See Comments)      Medication List        Accurate as of 05/08/18 10:13 AM. Always use your most recent med list.          calcium carbonate 600 MG Tabs  tablet Commonly known as:  OS-CAL Take 600 mg by mouth daily with breakfast.   doxycycline 100 MG tablet Commonly known as:  ADOXA   lisinopril 5 MG tablet Commonly known as:  PRINIVIL,ZESTRIL Take 1 tablet (5 mg total) by mouth daily.   LORazepam 0.5 MG tablet Commonly known as:  ATIVAN Take 1 tablet (0.5 mg total) by mouth 2 (two) times daily as needed for anxiety.   Magnesium 250 MG Tabs Take 250 mg by mouth daily.   omeprazole 20 MG capsule Commonly known as:  PRILOSEC Take 20 mg by mouth daily.   vitamin B-12 250 MCG tablet Commonly known as:  CYANOCOBALAMIN Take 250 mcg by mouth daily.   Vitamin D 2000 units Caps Take 200 Units by mouth daily.       Allergies:  Allergies  Allergen Reactions  . Aspirin     REACTION: upsets stomach  . Gluten Meal Other (See Comments)    Family History: Family History  Problem Relation Age of Onset  . Hypertension Mother   . Dementia Mother   . Colon cancer Mother   . Kidney disease Father   . Hypertension Father   . Diabetes Father   . Dementia Father   . Colon cancer Sister   . Mitral valve prolapse Daughter   . Anxiety disorder Daughter   . Ulcerative colitis Son   . Heart  attack Maternal Grandfather   . Colon cancer Paternal Grandmother   . CVA Paternal Grandfather   . Coronary artery disease Neg Hx     Social History:  reports that he has quit smoking. His smoking use included cigarettes. He quit after 1.00 year of use. He has never used smokeless tobacco. He reports that he does not drink alcohol or use drugs.  ROS: UROLOGY Frequent Urination?: No Hard to postpone urination?: No Burning/pain with urination?: No Get up at night to urinate?: No Leakage of urine?: No Urine stream starts and stops?: No Trouble starting stream?: No Do you have to strain to urinate?: No Blood in urine?: No Urinary tract infection?: No Sexually transmitted disease?: No Injury to kidneys or bladder?: No Painful  intercourse?: No Weak stream?: No Erection problems?: No Penile pain?: No  Gastrointestinal Nausea?: No Vomiting?: No Indigestion/heartburn?: No Diarrhea?: No Constipation?: No  Constitutional Fever: No Night sweats?: No Weight loss?: No Fatigue?: No  Skin Skin rash/lesions?: No Itching?: No  Eyes Blurred vision?: No Double vision?: No  Ears/Nose/Throat Sore throat?: No Sinus problems?: No  Hematologic/Lymphatic Swollen glands?: No Easy bruising?: No  Cardiovascular Leg swelling?: No Chest pain?: No  Respiratory Cough?: No Shortness of breath?: No  Endocrine Excessive thirst?: No  Musculoskeletal Back pain?: No Joint pain?: No  Neurological Headaches?: No Dizziness?: No  Psychologic Depression?: No Anxiety?: No  Physical Exam: BP 115/77 (BP Location: Left Arm, Patient Position: Sitting, Cuff Size: Normal)   Pulse (!) 123   Ht 5\' 10"  (1.778 m)   Wt 164 lb 3.2 oz (74.5 kg)   SpO2 99%   BMI 23.56 kg/m   Constitutional:  Alert and oriented, No acute distress.   Assessment & Plan:   72 year old male with a mildly elevated PSA however an elevated risk of high-grade prostate cancer on 4Kscore.  Based on these results I recommended scheduling prostate biopsy and he desires to proceed.  The procedure was discussed including potential risks of bleeding and infection/sepsis.    Abbie Sons, [place] [address], [place], [place] [address] [phone]

## 2018-05-16 ENCOUNTER — Encounter: Payer: Self-pay | Admitting: Family Medicine

## 2018-05-16 ENCOUNTER — Ambulatory Visit (INDEPENDENT_AMBULATORY_CARE_PROVIDER_SITE_OTHER): Payer: PPO | Admitting: Family Medicine

## 2018-05-16 VITALS — BP 122/80 | HR 69 | Temp 97.6°F | Resp 15 | Wt 164.6 lb

## 2018-05-16 DIAGNOSIS — W57XXXA Bitten or stung by nonvenomous insect and other nonvenomous arthropods, initial encounter: Secondary | ICD-10-CM | POA: Diagnosis not present

## 2018-05-16 DIAGNOSIS — B349 Viral infection, unspecified: Secondary | ICD-10-CM

## 2018-05-16 MED ORDER — DOXYCYCLINE HYCLATE 100 MG PO TABS: 100.0000 mg | ORAL_TABLET | Freq: Two times a day (BID) | ORAL | 1 refills | Status: DC

## 2018-05-16 NOTE — Patient Instructions (Signed)
Discussed use of Mucinex D for congestion and Delsym for cough. 

## 2018-05-16 NOTE — Progress Notes (Signed)
  Subjective:     Patient ID: Mike Spence, male   DOB: [birth year], 72 y.o.   MRN: [id number] Chief Complaint  Patient presents with  . Cough    Patient comes in office today with complaints of cough and congestion for the past 3 days. Patient states that in AM cough is productive of green phlegm, patient states that most of the time it is dry. Patient denies symptoms of shortness of breath or wheezing but states that he has had a runny nose and headache. Patient has been taking otc Claritin for relief .    HPI Reports wife has a cough and he pulled tick off his left shoulder about a week ago. Describes his sx as "flu-like." No fever documented though felt feverish.  Review of Systems     Objective:   Physical Exam  Constitutional: He appears well-developed and well-nourished. No distress.  Skin:  Left shoulder with nearly resolved insect bite. No rash, inflammation, or f.b. Noted.  Ears: T.M's intact without inflammation Throat: no tonsillar enlargement or exudate Neck: no cervical adenopathy Lungs: clear     Assessment:    1. Acute viral syndrome - doxycycline (VIBRA-TABS) 100 MG tablet; Take 1 tablet (100 mg total) by mouth 2 (two) times daily.  Dispense: 14 tablet; Refill: 1  2. Tick bite, initial encounter - doxycycline (VIBRA-TABS) 100 MG tablet; Take 1 tablet (100 mg total) by mouth 2 (two) times daily.  Dispense: 14 tablet; Refill: 1    Plan:    Discussed sx rx. For likely viral infection. May start abx for persistent fever and aches.

## 2018-05-17 ENCOUNTER — Ambulatory Visit: Payer: PPO | Admitting: Family Medicine

## 2018-06-20 ENCOUNTER — Other Ambulatory Visit: Payer: Self-pay | Admitting: Urology

## 2018-06-20 ENCOUNTER — Ambulatory Visit: Payer: PPO | Admitting: Urology

## 2018-06-20 ENCOUNTER — Encounter: Payer: Self-pay | Admitting: Urology

## 2018-06-20 VITALS — BP 158/79 | HR 118 | Resp 16 | Ht 71.0 in | Wt 163.2 lb

## 2018-06-20 DIAGNOSIS — R972 Elevated prostate specific antigen [PSA]: Secondary | ICD-10-CM

## 2018-06-20 DIAGNOSIS — N4231 Prostatic intraepithelial neoplasia: Secondary | ICD-10-CM | POA: Diagnosis not present

## 2018-06-20 MED ORDER — GENTAMICIN SULFATE 40 MG/ML IJ SOLN
80.0000 mg | Freq: Once | INTRAMUSCULAR | Status: AC
  Administered 2018-06-20: 80 mg via INTRAMUSCULAR

## 2018-06-20 MED ORDER — LEVOFLOXACIN 500 MG PO TABS
500.0000 mg | ORAL_TABLET | Freq: Once | ORAL | Status: AC
  Administered 2018-06-20: 500 mg via ORAL

## 2018-06-21 NOTE — Progress Notes (Signed)
Prostate Biopsy Procedure   Informed consent was obtained after discussing risks/benefits of the procedure.  A time out was performed to ensure correct patient identity.  Pre-Procedure: - Last PSA Level: 02/2018 5.1.  4K 04/2018 with a PSA of 5.6 and 56% probability of high-grade prostate cancer  - Gentamicin given prophylactically - Levaquin 500 mg administered PO -Transrectal Ultrasound performed revealing a 44 gm prostate -No significant hypoechoic or median lobe noted  Procedure: - Prostate block performed using 10 cc 1% lidocaine and biopsies taken from sextant areas, a total of 12 under ultrasound guidance.  Post-Procedure: - Patient tolerated the procedure well - He was counseled to seek immediate medical attention if experiences any severe pain, significant bleeding, or fevers - Return in one week to discuss biopsy results   John Giovanni, MD

## 2018-06-26 ENCOUNTER — Other Ambulatory Visit: Payer: PPO | Admitting: Urology

## 2018-06-27 ENCOUNTER — Other Ambulatory Visit: Payer: Self-pay | Admitting: Urology

## 2018-06-27 LAB — PATHOLOGY REPORT

## 2018-07-01 ENCOUNTER — Encounter: Payer: Self-pay | Admitting: Urology

## 2018-07-01 ENCOUNTER — Ambulatory Visit (INDEPENDENT_AMBULATORY_CARE_PROVIDER_SITE_OTHER): Payer: PPO | Admitting: Urology

## 2018-07-01 VITALS — BP 123/81 | HR 106 | Ht 70.0 in | Wt 164.0 lb

## 2018-07-01 DIAGNOSIS — N4231 Prostatic intraepithelial neoplasia: Secondary | ICD-10-CM

## 2018-07-01 DIAGNOSIS — R972 Elevated prostate specific antigen [PSA]: Secondary | ICD-10-CM | POA: Diagnosis not present

## 2018-07-01 NOTE — Progress Notes (Signed)
07/01/2018 1:26 PM   Mike Spence [birth year] [id number]  Referring provider: Margo Common, [place], [place] [address]  Chief Complaint  Patient presents with  . Results    HPI: 72 year old male presents for prostate biopsy follow-up.  Prostate biopsy was performed on 06/20/2018 for PSA of 5.1 and a 4K showing 56% probability of high-grade prostate cancer.  He had no post biopsy complaints.  Prostate volume was 44 g.  Standard 12 core biopsies were performed.  Pathology: The left mid core showed a focus of high-grade PIN and the remaining cores showed benign prostate tissue.   PMH: Past Medical History:  Diagnosis Date  . Anxiety   . Dysrhythmia   . GERD (gastroesophageal reflux disease)   . Hypertension   . Migraines   . Palpitations    Hx of them- identified as PVC's. did a holter monitor in 2/10 showing frequent PVC's. Additionally, the patient had an echcardiogram done in 2/10. EF 55%. there was some abnormal septal motion that may have been due to frequent PVC's. there was no significant valvular disorder and the RV was normal in size and function.    Surgical History: Past Surgical History:  Procedure Laterality Date  . COLONOSCOPY WITH PROPOFOL N/A 07/06/2016   Procedure: COLONOSCOPY WITH PROPOFOL;  Surgeon: Manya Silvas, MD;  [facility] ENDOSCOPY;  Service: Endoscopy;  Laterality: N/A;  . ESOPHAGOGASTRODUODENOSCOPY (EGD) WITH PROPOFOL N/A 07/06/2016   Procedure: ESOPHAGOGASTRODUODENOSCOPY (EGD) WITH PROPOFOL;  Surgeon: Manya Silvas, MD;  Location: [facility] ENDOSCOPY;  Service: Endoscopy;  Laterality: N/A;  . RETINAL DETACHMENT SURGERY  2008  . TONSILLECTOMY AND ADENOIDECTOMY  1950's  . VARICOSE VEIN SURGERY  2013    Home Medications:  Allergies as of 07/01/2018      Reactions   Aspirin    REACTION: upsets stomach   Gluten Meal Other (See Comments)      Medication List        Accurate as of 07/01/18  1:26 PM. Always use  your most recent med list.          calcium carbonate 600 MG Tabs tablet Commonly known as:  OS-CAL Take 600 mg by mouth daily with breakfast.   doxycycline 100 MG tablet Commonly known as:  VIBRA-TABS Take 1 tablet (100 mg total) by mouth 2 (two) times daily.   Magnesium 250 MG Tabs Take 250 mg by mouth daily.   omeprazole 20 MG capsule Commonly known as:  PRILOSEC Take 20 mg by mouth daily.   vitamin B-12 250 MCG tablet Commonly known as:  CYANOCOBALAMIN Take 250 mcg by mouth daily.   Vitamin D 2000 units Caps Take 200 Units by mouth daily.       Allergies:  Allergies  Allergen Reactions  . Aspirin     REACTION: upsets stomach  . Gluten Meal Other (See Comments)    Family History: Family History  Problem Relation Age of Onset  . Hypertension Mother   . Dementia Mother   . Colon cancer Mother   . Kidney disease Father   . Hypertension Father   . Diabetes Father   . Dementia Father   . Colon cancer Sister   . Mitral valve prolapse Daughter   . Anxiety disorder Daughter   . Ulcerative colitis Son   . Heart attack Maternal Grandfather   . Colon cancer Paternal Grandmother   . CVA Paternal Grandfather   . Coronary artery disease Neg Hx     Social  History:  reports that he has quit smoking. His smoking use included cigarettes. He quit after 1.00 year of use. He has never used smokeless tobacco. He reports that he does not drink alcohol or use drugs.  ROS: UROLOGY Frequent Urination?: No Hard to postpone urination?: No Burning/pain with urination?: No Get up at night to urinate?: No Leakage of urine?: No Urine stream starts and stops?: No Trouble starting stream?: No Do you have to strain to urinate?: No Blood in urine?: No Urinary tract infection?: No Sexually transmitted disease?: No Injury to kidneys or bladder?: No Painful intercourse?: No Weak stream?: No Erection problems?: No Penile pain?: No  Gastrointestinal Nausea?: No Vomiting?:  No Indigestion/heartburn?: No Diarrhea?: No Constipation?: No  Constitutional Fever: No Night sweats?: No Weight loss?: No Fatigue?: No  Skin Skin rash/lesions?: No Itching?: No  Eyes Blurred vision?: No Double vision?: No  Ears/Nose/Throat Sore throat?: No Sinus problems?: No  Hematologic/Lymphatic Swollen glands?: No Easy bruising?: No  Cardiovascular Leg swelling?: No Chest pain?: No  Respiratory Cough?: No Shortness of breath?: No  Endocrine Excessive thirst?: No  Musculoskeletal Back pain?: No Joint pain?: No  Neurological Headaches?: No Dizziness?: No  Psychologic Depression?: No Anxiety?: No  Physical Exam: BP 123/81 (BP Location: Left Arm, Patient Position: Sitting, Cuff Size: Normal)   Pulse (!) 106   Ht 5\' 10"  (1.778 m)   Wt 164 lb (74.4 kg)   BMI 23.53 kg/m   Constitutional:  Alert and oriented, No acute distress.   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  Continued monitoring of his PSA and DRE is recommended and he will follow-up in 6 months.  Return in about 6 months (around 01/01/2019) for Recheck, PSA.   Abbie Sons, [place] [address], [place], [place] [address] [phone]

## 2018-07-04 DIAGNOSIS — B351 Tinea unguium: Secondary | ICD-10-CM | POA: Diagnosis not present

## 2018-07-10 ENCOUNTER — Ambulatory Visit: Payer: PPO | Admitting: Urology

## 2018-12-30 ENCOUNTER — Other Ambulatory Visit: Payer: PPO

## 2018-12-30 DIAGNOSIS — N4231 Prostatic intraepithelial neoplasia: Secondary | ICD-10-CM

## 2018-12-30 DIAGNOSIS — R972 Elevated prostate specific antigen [PSA]: Secondary | ICD-10-CM | POA: Diagnosis not present

## 2018-12-31 LAB — PSA: Prostate Specific Ag, Serum: 6.8 ng/mL — ABNORMAL HIGH (ref 0.0–4.0)

## 2019-01-01 ENCOUNTER — Encounter: Payer: Self-pay | Admitting: Urology

## 2019-01-01 ENCOUNTER — Ambulatory Visit: Payer: PPO | Admitting: Urology

## 2019-01-01 VITALS — BP 128/71 | HR 120 | Ht 70.0 in | Wt 165.6 lb

## 2019-01-01 DIAGNOSIS — R972 Elevated prostate specific antigen [PSA]: Secondary | ICD-10-CM

## 2019-01-01 NOTE — Progress Notes (Signed)
01/01/2019 9:26 AM   Mike Spence Jul 16, 1946 [id number]  Referring provider: Margo Common, [place], [place] [address]  Chief Complaint  Patient presents with  . Elevated PSA   Urologic history: 1.  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  He has no complaints and denies bothersome lower urinary tract symptoms.  A repeat PSA performed on 12/30/2018 had increased to 6.8.   PMH: Past Medical History:  Diagnosis Date  . Anxiety   . Dysrhythmia   . GERD (gastroesophageal reflux disease)   . Hypertension   . Migraines   . Palpitations    Hx of them- identified as PVC's. did a holter monitor in 2/10 showing frequent PVC's. Additionally, the patient had an echcardiogram done in 2/10. EF 55%. there was some abnormal septal motion that may have been due to frequent PVC's. there was no significant valvular disorder and the RV was normal in size and function.    Surgical History: Past Surgical History:  Procedure Laterality Date  . COLONOSCOPY WITH PROPOFOL N/A 07/06/2016   Procedure: COLONOSCOPY WITH PROPOFOL;  Surgeon: Manya Silvas, MD;  Location: [facility] ENDOSCOPY;  Service: Endoscopy;  Laterality: N/A;  . ESOPHAGOGASTRODUODENOSCOPY (EGD) WITH PROPOFOL N/A 07/06/2016   Procedure: ESOPHAGOGASTRODUODENOSCOPY (EGD) WITH PROPOFOL;  Surgeon: Manya Silvas, MD;  Location: [facility] ENDOSCOPY;  Service: Endoscopy;  Laterality: N/A;  . RETINAL DETACHMENT SURGERY  2008  . TONSILLECTOMY AND ADENOIDECTOMY  1950's  . VARICOSE VEIN SURGERY  2013    Home Medications:  Allergies as of 01/01/2019      Reactions   Aspirin    REACTION: upsets stomach   Gluten Meal Other (See Comments)      Medication List       Accurate as of January 01, 2019  9:26 AM. Always use your most recent med list.        omeprazole 20 MG capsule Commonly known as:  PRILOSEC Take 20 mg by mouth daily.   vitamin B-12 250 MCG tablet Commonly known as:  CYANOCOBALAMIN Take 250 mcg by mouth daily.       Allergies:  Allergies  Allergen Reactions  . Aspirin     REACTION: upsets stomach  . Gluten Meal Other (See Comments)    Family History: Family History  Problem Relation Age of Onset  . Hypertension Mother   . Dementia Mother   . Colon cancer Mother   . Kidney disease Father   . Hypertension Father   . Diabetes Father   . Dementia Father   . Colon cancer Sister   . Mitral valve prolapse Daughter   . Anxiety disorder Daughter   . Ulcerative colitis Son   . Heart attack Maternal Grandfather   . Colon cancer Paternal Grandmother   . CVA Paternal Grandfather   . Coronary artery disease Neg Hx     Social History:  reports that he has quit smoking. His smoking use included cigarettes. He quit after 1.00 year of use. He has never used smokeless tobacco. He reports that he does not drink alcohol or use drugs.  ROS: UROLOGY Frequent Urination?: No Hard to postpone urination?: No Burning/pain with urination?: No Get up at night to urinate?: No Leakage of urine?: No Urine stream starts and stops?: No Trouble starting stream?: No Do you have  to strain to urinate?: No Blood in urine?: No Urinary tract infection?: No Sexually transmitted disease?: No Injury to kidneys or bladder?: No Painful intercourse?: No Weak stream?: No Erection problems?: No Penile pain?: No  Gastrointestinal Nausea?: No Vomiting?: No Indigestion/heartburn?: No Diarrhea?: No Constipation?: No  Constitutional Fever: No Night sweats?: No Weight loss?: No Fatigue?: No  Skin Skin rash/lesions?: No Itching?: No  Eyes Blurred vision?: No Double vision?: No  Ears/Nose/Throat Sore throat?: No Sinus problems?: No  Hematologic/Lymphatic Swollen glands?: No Easy bruising?: No  Cardiovascular Leg  swelling?: No Chest pain?: No  Respiratory Cough?: No Shortness of breath?: No  Endocrine Excessive thirst?: No  Musculoskeletal Back pain?: No Joint pain?: No  Neurological Headaches?: No Dizziness?: No  Psychologic Depression?: No Anxiety?: No  Physical Exam: BP 128/71 (BP Location: Left Arm, Patient Position: Sitting, Cuff Size: Normal)   Pulse (!) 120   Ht 5\' 10"  (1.778 m)   Wt 165 lb 9.6 oz (75.1 kg)   BMI 23.76 kg/m   Constitutional:  Alert and oriented, No acute distress. HEENT: [place] AT, moist mucus membranes.  Trachea midline, no masses. Cardiovascular: No clubbing, cyanosis, or edema. Respiratory: Normal respiratory effort, no increased work of breathing. GI: Abdomen is soft, nontender, nondistended, no abdominal masses GU: No CVA tenderness.  Prostate 50 g, smooth without nodules Lymph: No cervical or inguinal lymphadenopathy. Skin: No rashes, bruises or suspicious lesions. Neurologic: Grossly intact, no focal deficits, moving all 4 extremities. Psychiatric: Normal mood and affect.   Assessment & Plan:   73 year old male with an elevated PSA and prior biopsy showing a focus of high-grade PIN.  His most recent PSA has significantly increased.  DRE is benign.  I recommended scheduling a prostate MRI.  He will be notified with results and further recommendations.  Abbie Sons, [place] [address] [place], [place] [address] [phone]

## 2019-01-03 ENCOUNTER — Encounter: Payer: Self-pay | Admitting: Urology

## 2019-01-14 ENCOUNTER — Telehealth: Payer: Self-pay | Admitting: Urology

## 2019-01-14 ENCOUNTER — Ambulatory Visit (INDEPENDENT_AMBULATORY_CARE_PROVIDER_SITE_OTHER): Payer: PPO

## 2019-01-14 VITALS — BP 144/78 | HR 85 | Temp 97.6°F | Ht 70.0 in | Wt 166.4 lb

## 2019-01-14 DIAGNOSIS — Z23 Encounter for immunization: Secondary | ICD-10-CM | POA: Diagnosis not present

## 2019-01-14 DIAGNOSIS — Z0000 Encounter for general adult medical examination without abnormal findings: Secondary | ICD-10-CM | POA: Diagnosis not present

## 2019-01-14 NOTE — Patient Instructions (Signed)
Mike Spence , Thank you for taking time to come for your Medicare Wellness Visit. I appreciate your ongoing commitment to your health goals. Please review the following plan we discussed and let me know if I can assist you in the future.   Screening recommendations/referrals: Colonoscopy: Up to date, due 07/2021 Recommended yearly ophthalmology/optometry visit for glaucoma screening and checkup Recommended yearly dental visit for hygiene and checkup  Vaccinations: Influenza vaccine: Up to date Pneumococcal vaccine: Completed series Tdap vaccine: Up to date, due 06/2021 Shingles vaccine: Pt declines today.     Advanced directives: Please bring a copy of your POA (Power of Attorney) and/or Living Will to your next appointment.   Conditions/risks identified: Continue trying to increase water intake to 6-8 8 oz glasses a day.  Next appointment: 01/30/19 @ 10:40 AM with Mike Spence.   Preventive Care 25 Years and Older, Male Preventive care refers to lifestyle choices and visits with your health care provider that can promote health and wellness. What does preventive care include?  A yearly physical exam. This is also called an annual well check.  Dental exams once or twice a year.  Routine eye exams. Ask your health care provider how often you should have your eyes checked.  Personal lifestyle choices, including:  Daily care of your teeth and gums.  Regular physical activity.  Eating a healthy diet.  Avoiding tobacco and drug use.  Limiting alcohol use.  Practicing safe sex.  Taking low doses of aspirin every day.  Taking vitamin and mineral supplements as recommended by your health care provider. What happens during an annual well check? The services and screenings done by your health care provider during your annual well check will depend on your age, overall health, lifestyle risk factors, and family history of disease. Counseling  Your health care provider may ask  you questions about your:  Alcohol use.  Tobacco use.  Drug use.  Emotional well-being.  Home and relationship well-being.  Sexual activity.  Eating habits.  History of falls.  Memory and ability to understand (cognition).  Work and work Statistician. Screening  You may have the following tests or measurements:  Height, weight, and BMI.  Blood pressure.  Lipid and cholesterol levels. These may be checked every 5 years, or more frequently if you are over 35 years old.  Skin check.  Lung cancer screening. You may have this screening every year starting at age 23 if you have a 30-pack-year history of smoking and currently smoke or have quit within the past 15 years.  Fecal occult blood test (FOBT) of the stool. You may have this test every year starting at age 16.  Flexible sigmoidoscopy or colonoscopy. You may have a sigmoidoscopy every 5 years or a colonoscopy every 10 years starting at age 79.  Prostate cancer screening. Recommendations will vary depending on your family history and other risks.  Hepatitis C blood test.  Hepatitis B blood test.  Sexually transmitted disease (STD) testing.  Diabetes screening. This is done by checking your blood sugar (glucose) after you have not eaten for a while (fasting). You may have this done every 1-3 years.  Abdominal aortic aneurysm (AAA) screening. You may need this if you are a current or former smoker.  Osteoporosis. You may be screened starting at age 33 if you are at high risk. Talk with your health care provider about your test results, treatment options, and if necessary, the need for more tests. Vaccines  Your health care provider  may recommend certain vaccines, such as:  Influenza vaccine. This is recommended every year.  Tetanus, diphtheria, and acellular pertussis (Tdap, Td) vaccine. You may need a Td booster every 10 years.  Zoster vaccine. You may need this after age 79.  Pneumococcal 13-valent conjugate  (PCV13) vaccine. One dose is recommended after age 24.  Pneumococcal polysaccharide (PPSV23) vaccine. One dose is recommended after age 10. Talk to your health care provider about which screenings and vaccines you need and how often you need them. This information is not intended to replace advice given to you by your health care provider. Make sure you discuss any questions you have with your health care provider. Document Released: 12/17/2015 Document Revised: 08/09/2016 Document Reviewed: 09/21/2015 Elsevier Interactive Patient Education  2017 [employer] Prevention in the Home Falls can cause injuries. They can happen to people of all ages. There are many things you can do to make your home safe and to help prevent falls. What can I do on the outside of my home?  Regularly fix the edges of walkways and driveways and fix any cracks.  Remove anything that might make you trip as you walk through a door, such as a raised step or threshold.  Trim any bushes or trees on the path to your home.  Use bright outdoor lighting.  Clear any walking paths of anything that might make someone trip, such as rocks or tools.  Regularly check to see if handrails are loose or broken. Make sure that both sides of any steps have handrails.  Any raised decks and porches should have guardrails on the edges.  Have any leaves, snow, or ice cleared regularly.  Use sand or salt on walking paths during winter.  Clean up any spills in your garage right away. This includes oil or grease spills. What can I do in the bathroom?  Use night lights.  Install grab bars by the toilet and in the tub and shower. Do not use towel bars as grab bars.  Use non-skid mats or decals in the tub or shower.  If you need to sit down in the shower, use a plastic, non-slip stool.  Keep the floor dry. Clean up any water that spills on the floor as soon as it happens.  Remove soap buildup in the tub or shower  regularly.  Attach bath mats securely with double-sided non-slip rug tape.  Do not have throw rugs and other things on the floor that can make you trip. What can I do in the bedroom?  Use night lights.  Make sure that you have a light by your bed that is easy to reach.  Do not use any sheets or blankets that are too big for your bed. They should not hang down onto the floor.  Have a firm chair that has side arms. You can use this for support while you get dressed.  Do not have throw rugs and other things on the floor that can make you trip. What can I do in the kitchen?  Clean up any spills right away.  Avoid walking on wet floors.  Keep items that you use a lot in easy-to-reach places.  If you need to reach something above you, use a strong step stool that has a grab bar.  Keep electrical cords out of the way.  Do not use floor polish or wax that makes floors slippery. If you must use wax, use non-skid floor wax.  Do not have throw rugs  and other things on the floor that can make you trip. What can I do with my stairs?  Do not leave any items on the stairs.  Make sure that there are handrails on both sides of the stairs and use them. Fix handrails that are broken or loose. Make sure that handrails are as long as the stairways.  Check any carpeting to make sure that it is firmly attached to the stairs. Fix any carpet that is loose or worn.  Avoid having throw rugs at the top or bottom of the stairs. If you do have throw rugs, attach them to the floor with carpet tape.  Make sure that you have a light switch at the top of the stairs and the bottom of the stairs. If you do not have them, ask someone to add them for you. What else can I do to help prevent falls?  Wear shoes that:  Do not have high heels.  Have rubber bottoms.  Are comfortable and fit you well.  Are closed at the toe. Do not wear sandals.  If you use a stepladder:  Make sure that it is fully  opened. Do not climb a closed stepladder.  Make sure that both sides of the stepladder are locked into place.  Ask someone to hold it for you, if possible.  Clearly mark and make sure that you can see:  Any grab bars or handrails.  First and last steps.  Where the edge of each step is.  Use tools that help you move around (mobility aids) if they are needed. These include:  Canes.  Walkers.  Scooters.  Crutches.  Turn on the lights when you go into a dark area. Replace any light bulbs as soon as they burn out.  Set up your furniture so you have a clear path. Avoid moving your furniture around.  If any of your floors are uneven, fix them.  If there are any pets around you, be aware of where they are.  Review your medicines with your doctor. Some medicines can make you feel dizzy. This can increase your chance of falling. Ask your doctor what other things that you can do to help prevent falls. This information is not intended to replace advice given to you by your health care provider. Make sure you discuss any questions you have with your health care provider. Document Released: 09/16/2009 Document Revised: 04/27/2016 Document Reviewed: 12/25/2014 Elsevier Interactive Patient Education  2017 [employer].

## 2019-01-14 NOTE — Progress Notes (Addendum)
Subjective:   Mike Spence is a 73 y.o. male who presents for Medicare Annual/Subsequent preventive examination.  Review of Systems:  N/A  Cardiac Risk Factors include: advanced age (>34men, >46 women);hypertension;male gender     Objective:    Vitals: BP (!) 144/78 (BP Location: Right Arm)   Pulse 85   Temp 97.6 F (36.4 C) (Oral)   Ht 5\' 10"  (9.937 m)   Wt 166 lb 6.4 oz (75.5 kg)   BMI 23.88 kg/m   Body mass index is 23.88 kg/m.  Advanced Directives 01/14/2019 01/10/2018 01/03/2017 07/06/2016  Does Patient Have a Medical Advance Directive? Yes Yes Yes No  Type of Paramedic of [place];Living will [place];Living will [place];Living will -  Copy of [place] in Chart? No - copy requested No - copy requested No - copy requested -  Would patient like information on creating a medical advance directive? - - - No - patient declined information    Tobacco Social History   Tobacco Use  Smoking Status Former Smoker  . Years: 1.00  . Types: Cigarettes  Smokeless Tobacco Never Used  Tobacco Comment   >50 years ago in college     Counseling given: Not Answered Comment: >50 years ago in college   Clinical Intake:  Pre-visit preparation completed: Yes  Pain : No/denies pain Pain Score: 0-No pain     Nutritional Status: BMI of 19-24  Normal Nutritional Risks: None Diabetes: No  How often do you need to have someone help you when you read instructions, pamphlets, or other written materials from your doctor or pharmacy?: 1 - Never  Interpreter Needed?: No  Information entered by :: [facility], LPN  Past Medical History:  Diagnosis Date  . Anxiety   . Dysrhythmia   . GERD (gastroesophageal reflux disease)   . Hypertension   . Migraines   . Palpitations    Hx of them- identified as PVC's. did a holter monitor in 2/10 showing frequent PVC's. Additionally, the patient had an  echcardiogram done in 2/10. EF 55%. there was some abnormal septal motion that may have been due to frequent PVC's. there was no significant valvular disorder and the RV was normal in size and function.   Past Surgical History:  Procedure Laterality Date  . COLONOSCOPY WITH PROPOFOL N/A 07/06/2016   Procedure: COLONOSCOPY WITH PROPOFOL;  Surgeon: Manya Silvas, MD;  Location: [facility] ENDOSCOPY;  Service: Endoscopy;  Laterality: N/A;  . ESOPHAGOGASTRODUODENOSCOPY (EGD) WITH PROPOFOL N/A 07/06/2016   Procedure: ESOPHAGOGASTRODUODENOSCOPY (EGD) WITH PROPOFOL;  Surgeon: Manya Silvas, MD;  Location: [facility] ENDOSCOPY;  Service: Endoscopy;  Laterality: N/A;  . RETINAL DETACHMENT SURGERY  2008  . TONSILLECTOMY AND ADENOIDECTOMY  1950's  . VARICOSE VEIN SURGERY  2013   Family History  Problem Relation Age of Onset  . Hypertension Mother   . Dementia Mother   . Colon cancer Mother   . Kidney disease Father   . Hypertension Father   . Diabetes Father   . Dementia Father   . Colon cancer Sister   . Mitral valve prolapse Daughter   . Anxiety disorder Daughter   . Ulcerative colitis Son   . Heart attack Maternal Grandfather   . Colon cancer Paternal Grandmother   . CVA Paternal Grandfather   . Coronary artery disease Neg Hx    Social History   Socioeconomic History  . Marital status: Married    Spouse name: Not  on file  . Number of children: 2  . Years of education: Not on file  . Highest education level: Bachelor's degree (e.g., BA, AB, BS)  Occupational History  . Occupation: retired  Scientific laboratory technician  . Financial resource strain: Not hard at all  . Food insecurity:    Worry: Never true    Inability: Never true  . Transportation needs:    Medical: No    Non-medical: No  Tobacco Use  . Smoking status: Former Smoker    Years: 1.00    Types: Cigarettes  . Smokeless tobacco: Never Used  . Tobacco comment: >50 years ago in college  Substance and Sexual Activity  . Alcohol use: No     Alcohol/week: 0.0 standard drinks  . Drug use: No  . Sexual activity: Not Currently  Lifestyle  . Physical activity:    Days per week: 7 days    Minutes per session: 50 min  . Stress: Only a little  Relationships  . Social connections:    Talks on phone: Patient refused    Gets together: Patient refused    Attends religious service: Patient refused    Active member of club or organization: Patient refused    Attends meetings of clubs or organizations: Patient refused    Relationship status: Patient refused  Other Topics Concern  . Not on file  Social History Narrative   Married and lives in [place] with wife. Works for [employer]     Outpatient Encounter Medications as of 01/14/2019  Medication Sig  . omeprazole (PRILOSEC) 20 MG capsule Take 20 mg by mouth daily.   . vitamin B-12 (CYANOCOBALAMIN) 250 MCG tablet Take 250 mcg by mouth daily.    No facility-administered encounter medications on file as of 01/14/2019.     Activities of Daily Living In your present state of health, do you have any difficulty performing the following activities: 01/14/2019  Hearing? N  Vision? N  Difficulty concentrating or making decisions? N  Walking or climbing stairs? N  Dressing or bathing? N  Doing errands, shopping? N  Preparing Food and eating ? N  Using the Toilet? N  In the past six months, have you accidently leaked urine? N  Do you have problems with loss of bowel control? N  Managing your Medications? N  Managing your Finances? N  Housekeeping or managing your Housekeeping? N  Some recent data might be hidden    Patient Care Team: Chrismon, Vickki Muff, PA as PCP - General (Physician Assistant) Birder Robson, MD as Referring Physician (Ophthalmology) Manya Silvas, MD as Consulting Physician (Gastroenterology) Abbie Sons, MD (Urology)   Assessment:   This is a routine wellness examination for Yaseen.  Exercise Activities and Dietary recommendations Current  Exercise Habits: Home exercise routine;Structured exercise class, Type of exercise: strength training/weights;stretching;treadmill;walking;Other - see comments(stationary bike, aerobics, swimming), Time (Minutes): 45(to 1 hr), Frequency (Times/Week): 7, Weekly Exercise (Minutes/Week): 315, Intensity: Intense, Exercise limited by: None identified  Goals    . DIET - INCREASE WATER INTAKE     Recommend increasing water intake to 4-6 glasses a day.     . Increase water intake     01/03/17, I will increase my water intake to 4-5 glasses a day.       Fall Risk Fall Risk  01/14/2019 01/10/2018 01/03/2017 09/20/2015  Falls in the past year? 0 No No No   FALL RISK PREVENTION PERTAINING TO THE HOME:  Any stairs in or around the home  WITH handrails? Yes  Home free of loose throw rugs in walkways, pet beds, electrical cords, etc? Yes  Adequate lighting in your home to reduce risk of falls? Yes   ASSISTIVE DEVICES UTILIZED TO PREVENT FALLS:  Life alert? No  Use of a cane, walker or w/c? No  Grab bars in the bathroom? No  Shower chair or bench in shower? No  Elevated toilet seat or a handicapped toilet? No    TIMED UP AND GO:  Was the test performed? No .     Depression Screen PHQ 2/9 Scores 01/14/2019 01/14/2019 01/10/2018 01/10/2018  PHQ - 2 Score 0 0 0 0  PHQ- 9 Score 0 - 0 -    Cognitive Function: Declined today.     6CIT Screen 01/03/2017  What Year? 0 points  What month? 0 points  What time? 0 points  Count back from 20 0 points  Months in reverse 0 points  Repeat phrase 2 points  Total Score 2    Immunization History  Administered Date(s) Administered  . Influenza, High Dose Seasonal PF 09/20/2015, 01/03/2017, 01/17/2018  . Pneumococcal Conjugate-13 05/07/2014  . Pneumococcal Polysaccharide-23 09/20/2015  . Tdap 07/04/2011    Qualifies for Shingles Vaccine? Yes . Due for Shingrix. Education has been provided regarding the importance of this vaccine. Pt has been advised to  call insurance company to determine out of pocket expense. Advised may also receive vaccine at local pharmacy or Health Dept. Verbalized acceptance and understanding.  Tdap: Up to date  Flu Vaccine: Due for Flu vaccine. Does the patient want to receive this vaccine today?  Yes .   Pneumococcal Vaccine: Up to date  Screening Tests Health Maintenance  Topic Date Due  . COLONOSCOPY  07/07/2019  . TETANUS/TDAP  07/03/2021  . INFLUENZA VACCINE  Completed  . Hepatitis C Screening  Completed  . PNA vac Low Risk Adult  Completed   Cancer Screenings:  Colorectal Screening: Completed 07/06/16. Repeat every 5 years.  Lung Cancer Screening: (Low Dose CT Chest recommended if Age 64-80 years, 30 pack-year currently smoking OR have quit w/in 15years.) does not qualify.    Additional Screening:  Hepatitis C Screening: Up to date  Vision Screening: Recommended annual ophthalmology exams for early detection of glaucoma and other disorders of the eye.  Dental Screening: Recommended annual dental exams for proper oral hygiene  Community Resource Referral:  CRR required this visit?  No        Plan:  I have personally reviewed and addressed the Medicare Annual Wellness questionnaire and have noted the following in the patient's chart:  A. Medical and social history B. Use of alcohol, tobacco or illicit drugs  C. Current medications and supplements D. Functional ability and status E.  Nutritional status F.  Physical activity G. Advance directives H. List of other physicians I.  Hospitalizations, surgeries, and ER visits in previous 12 months J.  [place] such as hearing and vision if needed, cognitive and depression L. Referrals and appointments - none  In addition, I have reviewed and discussed with patient certain preventive protocols, quality metrics, and best practice recommendations. A written personalized care plan for preventive services as well as general preventive  health recommendations were provided to patient.  See attached scanned questionnaire for additional information.   Signed,  Fabio Neighbors, LPN Nurse Health Advisor   Nurse Recommendations: None.   Reviewed documentation and recommendations of Nurse Health Advisor from annual wellness screening. Was available for consultation. Agree  with note and plan.

## 2019-01-17 NOTE — Telephone Encounter (Signed)
error 

## 2019-01-27 NOTE — Progress Notes (Signed)
Patient: Mike Spence, Male    DOB: May 03, 1946, 73 y.o.   MRN: [id number] Visit Date: 01/30/2019  Today's Provider: [name], PA   Chief Complaint  Patient presents with  . Annual Exam   Subjective:   Patient saw McKenzie for AWV on 01/14/2019.   Complete Physical [name] is a 73 y.o. male. He feels well. He reports exercising yes. He reports he is sleeping well.  -----------------------------------------------------------   Review of Systems  Allergic/Immunologic: Positive for food allergies.  Neurological: Positive for headaches.  All other systems reviewed and are negative.   Social History   Socioeconomic History  . Marital status: Married    Spouse name: Not on file  . Number of children: 2  . Years of education: Not on file  . Highest education level: Bachelor's degree (e.g., BA, AB, BS)  Occupational History  . Occupation: retired  Scientific laboratory technician  . Financial resource strain: Not hard at all  . Food insecurity:    Worry: Never true    Inability: Never true  . Transportation needs:    Medical: No    Non-medical: No  Tobacco Use  . Smoking status: Former Smoker    Years: 1.00    Types: Cigarettes  . Smokeless tobacco: Never Used  . Tobacco comment: >50 years ago in college  Substance and Sexual Activity  . Alcohol use: No    Alcohol/week: 0.0 standard drinks  . Drug use: No  . Sexual activity: Not Currently  Lifestyle  . Physical activity:    Days per week: 7 days    Minutes per session: 50 min  . Stress: Only a little  Relationships  . Social connections:    Talks on phone: Patient refused    Gets together: Patient refused    Attends religious service: Patient refused    Active member of club or organization: Patient refused    Attends meetings of clubs or organizations: Patient refused    Relationship status: Patient refused  . Intimate partner violence:    Fear of current or ex partner: No    Emotionally abused: No      Physically abused: No    Forced sexual activity: No  Other Topics Concern  . Not on file  Social History Narrative   Married and lives in [place] with wife. Works for [employer], general practice.     Past Medical History:  Diagnosis Date  . Anxiety   . Dysrhythmia   . GERD (gastroesophageal reflux disease)   . Hypertension   . Migraines   . Palpitations    Hx of them- identified as PVC's. did a holter monitor in 2/10 showing frequent PVC's. Additionally, the patient had an echcardiogram done in 2/10. EF 55%. there was some abnormal septal motion that may have been due to frequent PVC's. there was no significant valvular disorder and the RV was normal in size and function.     Patient Active Problem List   Diagnosis Date Noted  . High grade prostatic intraepithelial neoplasia 07/01/2018  . Elevated PSA 05/08/2018  . History of Barrett's esophagus 09/28/2017  . Phlebectasia 09/20/2015  . Acid reflux 09/20/2015  . Anxiety, generalized 09/15/2015  . Personal history of tobacco use, presenting hazards to health 09/15/2015  . Anterior optic neuritis 09/15/2015  . HYPERTENSION, BENIGN 10/26/2010  . PALPITATIONS 10/26/2010  . Barrett esophagus 06/16/2008  . Leg varices 06/04/2007  . Diaphragmatic hernia 06/01/2006  . Esophagitis, reflux 06/01/2006  .  Essential (primary) hypertension 06/01/2006    Past Surgical History:  Procedure Laterality Date  . COLONOSCOPY WITH PROPOFOL N/A 07/06/2016   Procedure: COLONOSCOPY WITH PROPOFOL;  Surgeon: Manya Silvas, MD;  Location: [facility] ENDOSCOPY;  Service: Endoscopy;  Laterality: N/A;  . ESOPHAGOGASTRODUODENOSCOPY (EGD) WITH PROPOFOL N/A 07/06/2016   Procedure: ESOPHAGOGASTRODUODENOSCOPY (EGD) WITH PROPOFOL;  Surgeon: Manya Silvas, MD;  Location: [facility] ENDOSCOPY;  Service: Endoscopy;  Laterality: N/A;  . RETINAL DETACHMENT SURGERY  2008  . TONSILLECTOMY AND ADENOIDECTOMY  1950's  . VARICOSE VEIN SURGERY  2013    His family history includes  Anxiety disorder in his daughter; CVA in his paternal grandfather; Colon cancer in his mother, paternal grandmother, and sister; Dementia in his father and mother; Diabetes in his father; Heart attack in his maternal grandfather; Hypertension in his father and mother; Kidney disease in his father; Mitral valve prolapse in his daughter; Ulcerative colitis in his son. There is no history of Coronary artery disease.   Current Outpatient Medications:  .  omeprazole (PRILOSEC) 20 MG capsule, Take 20 mg by mouth daily. , Disp: , Rfl:  .  vitamin B-12 (CYANOCOBALAMIN) 250 MCG tablet, Take 250 mcg by mouth daily. , Disp: , Rfl:   Patient Care Team: Chrismon, Vickki Muff, PA as PCP - General (Physician Assistant) Birder Robson, MD as Referring Physician (Ophthalmology) Manya Silvas, MD as Consulting Physician (Gastroenterology) Abbie Sons, MD (Urology)     Objective:    Vitals: BP (!) 142/78 (BP Location: Right Arm, Patient Position: Sitting, Cuff Size: Normal)   Pulse 78   Temp (!) 97.3 F (36.3 C) (Oral)   Ht 5\' 10"  (1.778 m)   Wt 164 lb 12.8 oz (74.8 kg)   SpO2 98%   BMI 23.65 kg/m   Physical Exam Constitutional:      Appearance: He is well-developed.  HENT:     Head: Normocephalic and atraumatic.     Right Ear: External ear normal.     Left Ear: External ear normal.     Nose: Nose normal.  Eyes:     General:        Right eye: No discharge.     Conjunctiva/sclera: Conjunctivae normal.     Pupils: Pupils are equal, round, and reactive to light.  Neck:     Musculoskeletal: Normal range of motion and neck supple.     Thyroid: No thyromegaly.     Trachea: No tracheal deviation.  Cardiovascular:     Rate and Rhythm: Normal rate and regular rhythm.     Heart sounds: Normal heart sounds. No murmur.  Pulmonary:     Effort: Pulmonary effort is normal. No respiratory distress.     Breath sounds: Normal breath sounds. No wheezing or rales.  Chest:     Chest wall: No  tenderness.  Abdominal:     General: There is no distension.     Palpations: Abdomen is soft. There is no mass.     Tenderness: There is no abdominal tenderness. There is no guarding or rebound.  Genitourinary:    Comments: PSA elevation and enlarged prostate followed by Dr. Bernardo Heater (urologist). Musculoskeletal: Normal range of motion.        General: No tenderness.  Lymphadenopathy:     Cervical: No cervical adenopathy.  Skin:    General: Skin is warm and dry.     Findings: No erythema or rash.  Neurological:     Mental Status: He is alert and oriented to person, place,  and time.     Cranial Nerves: No cranial nerve deficit.     Motor: No abnormal muscle tone.     Coordination: Coordination normal.     Deep Tendon Reflexes: Reflexes are normal and symmetric. Reflexes normal.  Psychiatric:        Behavior: Behavior normal.        Thought Content: Thought content normal.        Judgment: Judgment normal.     Activities of Daily Living In your present state of health, do you have any difficulty performing the following activities: 01/14/2019  Hearing? N  Vision? N  Difficulty concentrating or making decisions? N  Walking or climbing stairs? N  Dressing or bathing? N  Doing errands, shopping? N  Preparing Food and eating ? N  Using the Toilet? N  In the past six months, have you accidently leaked urine? N  Do you have problems with loss of bowel control? N  Managing your Medications? N  Managing your Finances? N  Housekeeping or managing your Housekeeping? N  Some recent data might be hidden    Fall Risk Assessment Fall Risk  01/14/2019 01/10/2018 01/03/2017 09/20/2015  Falls in the past year? 0 No No No     Depression Screen PHQ 2/9 Scores 01/14/2019 01/14/2019 01/10/2018 01/10/2018  PHQ - 2 Score 0 0 0 0  PHQ- 9 Score 0 - 0 -    6CIT Screen 01/03/2017  What Year? 0 points  What month? 0 points  What time? 0 points  Count back from 20 0 points  Months in reverse 0  points  Repeat phrase 2 points  Total Score 2       Assessment & Plan:    Annual Physical Reviewed patient's Family Medical History Reviewed and updated list of patient's medical providers Assessment of cognitive impairment was done Assessed patient's functional ability Established a written schedule for health screening [place] Completed and Reviewed  Exercise Activities and Dietary recommendations Goals    . DIET - INCREASE WATER INTAKE     Recommend increasing water intake to 4-6 glasses a day.     . Increase water intake     01/03/17, I will increase my water intake to 4-5 glasses a day.       Immunization History  Administered Date(s) Administered  . Influenza, High Dose Seasonal PF 09/20/2015, 01/03/2017, 01/17/2018, 01/14/2019  . Pneumococcal Conjugate-13 05/07/2014  . Pneumococcal Polysaccharide-23 09/20/2015  . Tdap 07/04/2011    Health Maintenance  Topic Date Due  . COLONOSCOPY  07/07/2019  . TETANUS/TDAP  07/03/2021  . INFLUENZA VACCINE  Completed  . Hepatitis C Screening  Completed  . PNA vac Low Risk Adult  Completed     Discussed health benefits of physical activity, and encouraged him to engage in regular exercise appropriate for his age and condition.    ------------------------------------------------------------------------------------------------------------  1. Essential (primary) hypertension Stable with better reading today than in the past. Still gets elevation with "White Coat Syndrome" and anxiety. Continues to use Ativan prn significant anxiety and intermittent use of Lisinopril 5 mg. Recheck labs and follow up pending reports. Immunizations up to date. Medicare wellness exam essentially normal. - CBC with Differential/Platelet - Comprehensive metabolic panel - Lipid panel - TSH  2. Asymptomatic varicose veins of both lower extremities Many large varicosities in the right leg - smaller in the left leg.  Asymptomatic without ulcerations or edema.  3. High grade prostatic intraepithelial neoplasia PSA 5.1 on 01-21-18  and had evaluation by Dr. Bernardo Heater (urologist) with increase of PSA to 6.8 on 12-30-18 per Dr. Bernardo Heater. In the process of scheduling MRI evaluation. Recheck renal function and electrolytes. - Comprehensive metabolic panel  4. History of Barrett's esophagus Upper endoscopy on 07-06-16 showed multiple gastric polyps and short segment Barrett's Esophagus. Tolerating Omeprazole 20 mg qd without dyspepsia, hematemesis or abdominal pain. Recheck labs and follow up with Dr. Vira Agar (GI) as planned. - CBC with Differential/Platelet  5. Hypercholesterolemia Had LDL 128 and total cholesterol 216 with HDL 77 on 01-21-18. Continue present daily exercise program and low fat diet. Recheck CMP, Lipid Panel and TSH. Follow up pending reports. - Comprehensive metabolic panel - Lipid panel - TSH  6. History of colon polyps Last colonoscopy on 07-06-16 by Dr. Vira Agar (GI) showed multiple benign polyps. Was advised to get follow up colonoscopy this summer. Expects a call from Dr. Edd Fabian office to set up procedure. No hematochezia or melena.   [name], PA  [facility]

## 2019-01-30 ENCOUNTER — Encounter: Payer: Self-pay | Admitting: Family Medicine

## 2019-01-30 ENCOUNTER — Ambulatory Visit (INDEPENDENT_AMBULATORY_CARE_PROVIDER_SITE_OTHER): Payer: PPO | Admitting: Family Medicine

## 2019-01-30 VITALS — BP 142/78 | HR 78 | Temp 97.3°F | Ht 70.0 in | Wt 164.8 lb

## 2019-01-30 DIAGNOSIS — I8393 Asymptomatic varicose veins of bilateral lower extremities: Secondary | ICD-10-CM | POA: Diagnosis not present

## 2019-01-30 DIAGNOSIS — Z8719 Personal history of other diseases of the digestive system: Secondary | ICD-10-CM

## 2019-01-30 DIAGNOSIS — N4231 Prostatic intraepithelial neoplasia: Secondary | ICD-10-CM | POA: Diagnosis not present

## 2019-01-30 DIAGNOSIS — Z860100 Personal history of colon polyps, unspecified: Secondary | ICD-10-CM

## 2019-01-30 DIAGNOSIS — E7800 Pure hypercholesterolemia, unspecified: Secondary | ICD-10-CM | POA: Diagnosis not present

## 2019-01-30 DIAGNOSIS — I10 Essential (primary) hypertension: Secondary | ICD-10-CM | POA: Diagnosis not present

## 2019-01-31 LAB — COMPREHENSIVE METABOLIC PANEL
ALK PHOS: 65 IU/L (ref 39–117)
ALT: 20 IU/L (ref 0–44)
AST: 22 IU/L (ref 0–40)
Albumin/Globulin Ratio: 2.1 (ref 1.2–2.2)
Albumin: 4.8 g/dL — ABNORMAL HIGH (ref 3.7–4.7)
BILIRUBIN TOTAL: 0.5 mg/dL (ref 0.0–1.2)
BUN/Creatinine Ratio: 15 (ref 10–24)
BUN: 17 mg/dL (ref 8–27)
CHLORIDE: 102 mmol/L (ref 96–106)
CO2: 24 mmol/L (ref 20–29)
Calcium: 9.6 mg/dL (ref 8.6–10.2)
Creatinine, Ser: 1.11 mg/dL (ref 0.76–1.27)
GFR calc Af Amer: 75 mL/min/{1.73_m2} (ref >59)
GFR calc non Af Amer: 65 mL/min/{1.73_m2} (ref >59)
GLOBULIN, TOTAL: 2.3 g/dL (ref 1.5–4.5)
GLUCOSE: 98 mg/dL (ref 65–99)
Potassium: 4 mmol/L (ref 3.5–5.2)
Sodium: 142 mmol/L (ref 134–144)
Total Protein: 7.1 g/dL (ref 6.0–8.5)

## 2019-01-31 LAB — LIPID PANEL
CHOL/HDL RATIO: 2.9 ratio (ref 0.0–5.0)
Cholesterol, Total: 237 mg/dL — ABNORMAL HIGH (ref 100–199)
HDL: 81 mg/dL (ref >39)
LDL CALC: 144 mg/dL — AB (ref 0–99)
Triglycerides: 58 mg/dL (ref 0–149)
VLDL Cholesterol Cal: 12 mg/dL (ref 5–40)

## 2019-01-31 LAB — TSH: TSH: 2.35 u[IU]/mL (ref 0.450–4.500)

## 2019-01-31 LAB — CBC WITH DIFFERENTIAL/PLATELET
BASOS ABS: 0.1 10*3/uL (ref 0.0–0.2)
Basos: 1 %
EOS (ABSOLUTE): 0 10*3/uL (ref 0.0–0.4)
Eos: 0 %
HEMATOCRIT: 42.2 % (ref 37.5–51.0)
HEMOGLOBIN: 13.9 g/dL (ref 13.0–17.7)
Immature Grans (Abs): 0 10*3/uL (ref 0.0–0.1)
Immature Granulocytes: 0 %
LYMPHS ABS: 2.3 10*3/uL (ref 0.7–3.1)
Lymphs: 30 %
MCH: 31.6 pg (ref 26.6–33.0)
MCHC: 32.9 g/dL (ref 31.5–35.7)
MCV: 96 fL (ref 79–97)
MONOCYTES: 9 %
MONOS ABS: 0.7 10*3/uL (ref 0.1–0.9)
NEUTROS ABS: 4.6 10*3/uL (ref 1.4–7.0)
Neutrophils: 60 %
Platelets: 255 10*3/uL (ref 150–450)
RBC: 4.4 x10E6/uL (ref 4.14–5.80)
RDW: 12 % (ref 11.6–15.4)
WBC: 7.6 10*3/uL (ref 3.4–10.8)

## 2019-02-03 ENCOUNTER — Ambulatory Visit
Admission: RE | Admit: 2019-02-03 | Discharge: 2019-02-03 | Disposition: A | Discharge status: Home / Self Care | Payer: PPO | Source: Ambulatory Visit | Attending: Urology | Admitting: Urology

## 2019-02-03 DIAGNOSIS — R972 Elevated prostate specific antigen [PSA]: Secondary | ICD-10-CM

## 2019-02-03 MED ORDER — GADOBENATE DIMEGLUMINE 529 MG/ML IV SOLN
15.0000 mL | Freq: Once | INTRAVENOUS | Status: AC | PRN
  Administered 2019-02-03: 15 mL via INTRAVENOUS

## 2019-02-04 ENCOUNTER — Telehealth: Payer: Self-pay | Admitting: Family Medicine

## 2019-02-04 ENCOUNTER — Other Ambulatory Visit: Payer: Self-pay | Admitting: Family Medicine

## 2019-02-04 DIAGNOSIS — E7800 Pure hypercholesterolemia, unspecified: Secondary | ICD-10-CM

## 2019-02-04 MED ORDER — ATORVASTATIN CALCIUM 20 MG PO TABS: 20.0000 mg | ORAL_TABLET | Freq: Every day | ORAL | 3 refills | Status: DC

## 2019-02-04 NOTE — Telephone Encounter (Signed)
Patient was advised.  

## 2019-02-04 NOTE — Telephone Encounter (Signed)
Pt was in about a week ago.  Mike Spence had told him to take red yeast but he would like to take a statin drug for his cholesterol.  He dos not feel the red yeast is going to help  Pt's CB#  [address]  ter[place]

## 2019-02-04 NOTE — Telephone Encounter (Signed)
Please advise 

## 2019-02-04 NOTE — Telephone Encounter (Signed)
Done. Recheck progress in 3 months.

## 2019-02-05 ENCOUNTER — Encounter: Payer: Self-pay | Admitting: Urology

## 2019-03-25 DIAGNOSIS — Z800 Family history of malignant neoplasm of digestive organs: Secondary | ICD-10-CM | POA: Diagnosis not present

## 2019-03-25 DIAGNOSIS — Z86010 Personal history of colonic polyps: Secondary | ICD-10-CM | POA: Diagnosis not present

## 2019-03-25 DIAGNOSIS — K219 Gastro-esophageal reflux disease without esophagitis: Secondary | ICD-10-CM | POA: Diagnosis not present

## 2019-05-05 ENCOUNTER — Encounter: Payer: Self-pay | Admitting: Family Medicine

## 2019-05-05 ENCOUNTER — Ambulatory Visit (INDEPENDENT_AMBULATORY_CARE_PROVIDER_SITE_OTHER): Payer: PPO | Admitting: Family Medicine

## 2019-05-05 ENCOUNTER — Other Ambulatory Visit: Payer: Self-pay

## 2019-05-05 VITALS — BP 121/71 | Temp 96.0°F

## 2019-05-05 DIAGNOSIS — K22710 Barrett's esophagus with low grade dysplasia: Secondary | ICD-10-CM | POA: Diagnosis not present

## 2019-05-05 DIAGNOSIS — E7800 Pure hypercholesterolemia, unspecified: Secondary | ICD-10-CM | POA: Diagnosis not present

## 2019-05-05 DIAGNOSIS — I10 Essential (primary) hypertension: Secondary | ICD-10-CM | POA: Diagnosis not present

## 2019-05-05 DIAGNOSIS — R51 Headache: Secondary | ICD-10-CM | POA: Diagnosis not present

## 2019-05-05 DIAGNOSIS — R519 Headache, unspecified: Secondary | ICD-10-CM

## 2019-05-05 NOTE — Progress Notes (Signed)
Mike Spence  MRN: [id number] DOB: Sep 02, 1946  Subjective:  HPI   The patient is a 73 year old male who presents via electronic device for follow up visit.   Virtual Visit via Video Note  I connected with Comer Locket on 05/05/19 at  8:40 AM EDT by a video enabled telemedicine application and verified that I am speaking with the correct person using two identifiers.  Location: Patient: home Provider: office   I discussed the limitations of evaluation and management by telemedicine and the availability of in person appointments. The patient expressed understanding and agreed to proceed.   Patient Active Problem List   Diagnosis Date Noted  . High grade prostatic intraepithelial neoplasia 07/01/2018  . Elevated PSA 05/08/2018  . History of Barrett's esophagus 09/28/2017  . Phlebectasia 09/20/2015  . Acid reflux 09/20/2015  . Anxiety, generalized 09/15/2015  . Personal history of tobacco use, presenting hazards to health 09/15/2015  . Anterior optic neuritis 09/15/2015  . HYPERTENSION, BENIGN 10/26/2010  . PALPITATIONS 10/26/2010  . Barrett esophagus 06/16/2008  . Leg varices 06/04/2007  . Diaphragmatic hernia 06/01/2006  . Esophagitis, reflux 06/01/2006  . Essential (primary) hypertension 06/01/2006    Past Medical History:  Diagnosis Date  . Anxiety   . Dysrhythmia   . GERD (gastroesophageal reflux disease)   . Hypertension   . Migraines   . Palpitations    Hx of them- identified as PVC's. did a holter monitor in 2/10 showing frequent PVC's. Additionally, the patient had an echcardiogram done in 2/10. EF 55%. there was some abnormal septal motion that may have been due to frequent PVC's. there was no significant valvular disorder and the RV was normal in size and function.    Social History   Socioeconomic History  . Marital status: Married    Spouse name: Not on file  . Number of children: 2  . Years of education: Not on file  . Highest education  level: Bachelor's degree (e.g., BA, AB, BS)  Occupational History  . Occupation: retired  Scientific laboratory technician  . Financial resource strain: Not hard at all  . Food insecurity:    Worry: Never true    Inability: Never true  . Transportation needs:    Medical: No    Non-medical: No  Tobacco Use  . Smoking status: Former Smoker    Years: 1.00    Types: Cigarettes  . Smokeless tobacco: Never Used  . Tobacco comment: >50 years ago in college  Substance and Sexual Activity  . Alcohol use: No    Alcohol/week: 0.0 standard drinks  . Drug use: No  . Sexual activity: Not Currently  Lifestyle  . Physical activity:    Days per week: 7 days    Minutes per session: 50 min  . Stress: Only a little  Relationships  . Social connections:    Talks on phone: Patient refused    Gets together: Patient refused    Attends religious service: Patient refused    Active member of club or organization: Patient refused    Attends meetings of clubs or organizations: Patient refused    Relationship status: Patient refused  . Intimate partner violence:    Fear of current or ex partner: No    Emotionally abused: No    Physically abused: No    Forced sexual activity: No  Other Topics Concern  . Not on file  Social History Narrative   Married and lives in [place] with wife. Works for [employer]     Outpatient Encounter Medications as of 05/05/2019  Medication Sig  . omeprazole (PRILOSEC) 20 MG capsule Take 20 mg by mouth daily.   . vitamin B-12 (CYANOCOBALAMIN) 250 MCG tablet Take 250 mcg by mouth daily.   . [DISCONTINUED] atorvastatin (LIPITOR) 20 MG tablet Take 1 tablet (20 mg total) by mouth daily.   No facility-administered encounter medications on file as of 05/05/2019.     Allergies  Allergen Reactions  . Aspirin     REACTION: upsets stomach  . Gluten Meal Other (See Comments)    Review of Systems  Constitutional: Negative.   HENT: Negative.   Respiratory: Negative.   Cardiovascular:  Negative.   Gastrointestinal: Positive for heartburn. Negative for blood in stool and melena.  Genitourinary: Negative.   Musculoskeletal: Negative.   Neurological: Negative for headaches.    Objective:   Per the patient he took his blood pressure yesterday and got a reading of 121/71 with Temp 96.0  WDWN male in no apparent distress.  Head: Normocephalic, atraumatic. Neck: Supple, NROM Respiratory: No apparent distress Psych: Normal mood and affect   Assessment and Plan :  1. Essential (primary) hypertension Well controlled with BP at home always much better than when checked in the office (121/71 yesterday at home). No palpitations, chest pains or significant edema (history of large varicose veins both legs). Continue to exercise regularly and restrict sodium intake. Recheck follow up labs in 3-4 months.  2. Barrett's esophagus with low grade dysplasia Has decreased the Omeprazole to 10 g qd and states follow up with Dr. Vira Agar (GI) is planned in October 2020. He is considering getting colonoscopy at that time, also. Denies melena or hematochezia. Reflux symptoms are fairly controlled with the Omeprazole and occasional use of antacid. Recheck follow up labs in 3-4 months.  3. Nonintractable headache, unspecified chronicity pattern, unspecified headache type States he has had "migraines" for many years and are improved when he can swim daily. When the headaches occur he will have a frontal and posterior neck pain with nausea that lasts 12 hours on average and occur every 7-10 days. He does not want to try any medications, just the exercise.   4. Elevated LDL cholesterol level Reviewed last lipid reports from 01-30-19 that showed total cholesterol 237 and LDL 144 with HDL 81. Risk ratio on 2.9. Patient has been restricting ice cream in diet and continues to exercise 40 minutes daily. Will try Red Yeast Rice but decided against the Lipitor after reading the possible side effects. Will  recheck levels in the next 3-4 months.   I discussed the assessment and treatment plan with the patient. The patient was provided an opportunity to ask questions and all were answered. The patient agreed with the plan and demonstrated an understanding of the instructions.   The patient was advised to call back or seek an in-person evaluation if the symptoms worsen or if the condition fails to improve as anticipated.  I provided 22 minutes of non-face-to-face time during this encounter.

## 2019-06-20 ENCOUNTER — Other Ambulatory Visit: Payer: Self-pay

## 2019-06-26 ENCOUNTER — Ambulatory Visit: Payer: Self-pay | Admitting: Urology

## 2019-09-10 ENCOUNTER — Other Ambulatory Visit: Payer: Self-pay

## 2019-09-10 ENCOUNTER — Ambulatory Visit (INDEPENDENT_AMBULATORY_CARE_PROVIDER_SITE_OTHER): Payer: PPO

## 2019-09-10 DIAGNOSIS — Z23 Encounter for immunization: Secondary | ICD-10-CM

## 2019-09-19 ENCOUNTER — Other Ambulatory Visit: Admission: RE | Admit: 2019-09-19 | Payer: PPO | Source: Ambulatory Visit

## 2019-09-24 ENCOUNTER — Ambulatory Visit: Admit: 2019-09-24 | Payer: PPO | Admitting: Unknown Physician Specialty

## 2019-09-24 SURGERY — COLONOSCOPY WITH PROPOFOL
Anesthesia: General

## 2019-09-26 ENCOUNTER — Other Ambulatory Visit
Admission: RE | Admit: 2019-09-26 | Discharge: 2019-09-26 | Disposition: A | Discharge status: Home / Self Care | Payer: PPO | Source: Ambulatory Visit | Attending: Internal Medicine | Admitting: Internal Medicine

## 2019-09-26 DIAGNOSIS — Z01812 Encounter for preprocedural laboratory examination: Secondary | ICD-10-CM | POA: Insufficient documentation

## 2019-09-26 DIAGNOSIS — Z20828 Contact with and (suspected) exposure to other viral communicable diseases: Secondary | ICD-10-CM | POA: Diagnosis not present

## 2019-09-26 LAB — SARS CORONAVIRUS 2 (TAT 6-24 HRS): SARS Coronavirus 2: NEGATIVE

## 2019-09-29 ENCOUNTER — Encounter: Admission: RE | Payer: Self-pay | Source: Home / Self Care

## 2019-09-29 ENCOUNTER — Ambulatory Visit: Admission: RE | Admit: 2019-09-29 | Payer: PPO | Source: Home / Self Care | Admitting: Internal Medicine

## 2019-09-29 SURGERY — COLONOSCOPY WITH PROPOFOL
Anesthesia: General

## 2019-10-01 ENCOUNTER — Telehealth: Payer: Self-pay | Admitting: General Practice

## 2019-10-01 NOTE — Telephone Encounter (Signed)
Gave patient covid negative covid test results Patient understood. Patient needs test results released to mychart. Call back [phone]

## 2019-10-01 NOTE — Telephone Encounter (Signed)
COVID results have been released to [place].

## 2019-12-15 ENCOUNTER — Ambulatory Visit: Payer: Medicare Other | Attending: Internal Medicine

## 2019-12-15 DIAGNOSIS — Z23 Encounter for immunization: Secondary | ICD-10-CM

## 2019-12-15 NOTE — Progress Notes (Signed)
    Covid-19 Vaccination Clinic  Name:  Mike Spence    MRN: [id number] DOB: 1946/06/23  12/15/2019  [name] was observed post Covid-19 immunization for 15 minutes without incidence. He was provided with Vaccine Information Sheet and instruction to access the V-Safe system.   [name] was instructed to call 911 with any severe reactions post vaccine: Marland Kitchen Difficulty breathing  . Swelling of your face and throat  . A fast heartbeat  . A bad rash all over your body  . Dizziness and weakness    Immunizations Administered    Name Date Dose VIS Date Route   Pfizer COVID-19 Vaccine 12/15/2019  9:04 AM 0.3 mL 11/14/2019 Intramuscular   Manufacturer: [employer], [employer]   Lot: [id number]   [place]: [id number]

## 2019-12-29 ENCOUNTER — Telehealth: Payer: Self-pay | Admitting: Gastroenterology

## 2019-12-29 ENCOUNTER — Telehealth: Payer: Self-pay

## 2019-12-29 DIAGNOSIS — Z86010 Personal history of colonic polyps: Secondary | ICD-10-CM

## 2019-12-29 DIAGNOSIS — K22710 Barrett's esophagus with low grade dysplasia: Secondary | ICD-10-CM

## 2019-12-29 NOTE — Telephone Encounter (Signed)
Dr. Rush Landmark, This pt would like to transfer care to [place] from [facility]  Dr. Luellen Pucker. (Since Dr. Tiffany Kocher has retired.) He has requested you.  He had his last Colonoscopy in 2017. I will pt notes on your desk for review. Will you accept this pt?

## 2019-12-29 NOTE — Telephone Encounter (Signed)
Amelia, I have had a chance to review the patient's pathology and previous EGD/colonoscopy report as they were in epic. EGD showed significant acid reflux. Patient has multiple colon polyps including TIAs and sessile serrated adenomas. The patient can be scheduled for a EGD/colonoscopy at his convenience in the [facility].  If he would like to be seen in clinic first to discuss, I would be happy to see him and then we can schedule her thereafter. Please offer him these options and let me know what he decides. Thanks. GM

## 2019-12-29 NOTE — Telephone Encounter (Signed)
Copied from [place] [phone]. Topic: Referral - Request for Referral >> Dec 29, 2019  1:09 PM Rayann Heman wrote: Has patient seen PCP for this complaint? Yes Referral for which specialty: GI  Preferred provider/office: [facility] Dr Carlean Purl, Dr Ardis Hughs, Dr Hilarie Fredrickson, Dr Fuller Plan or Dr Rush Landmark  Reason for referral: needs colonoscopy

## 2019-12-30 ENCOUNTER — Encounter: Payer: Self-pay | Admitting: Gastroenterology

## 2019-12-30 NOTE — Telephone Encounter (Signed)
Thank you for update. GM 

## 2019-12-30 NOTE — Telephone Encounter (Signed)
Thank you,  Dr. Rush Landmark, The patient has scheduled an appointment to discuss with you EGD/Colonoscopy.

## 2019-12-30 NOTE — Addendum Note (Signed)
Addended by: Birdie Sons on: 12/30/2019 02:43 PM   Modules accepted: Orders

## 2020-01-04 ENCOUNTER — Ambulatory Visit: Payer: PPO | Attending: Internal Medicine

## 2020-01-04 DIAGNOSIS — Z23 Encounter for immunization: Secondary | ICD-10-CM

## 2020-01-04 NOTE — Progress Notes (Signed)
   Covid-19 Vaccination Clinic  Name:  Mike Spence    MRN: [id number] DOB: 1946/04/27  01/04/2020  [name] was observed post Covid-19 immunization for 15 minutes without incidence. He was provided with Vaccine Information Sheet and instruction to access the V-Safe system.   [name] was instructed to call 911 with any severe reactions post vaccine: Marland Kitchen Difficulty breathing  . Swelling of your face and throat  . A fast heartbeat  . A bad rash all over your body  . Dizziness and weakness    Immunizations Administered    Name Date Dose VIS Date Route   Pfizer COVID-19 Vaccine 01/04/2020  9:54 AM 0.3 mL 11/14/2019 Intramuscular   Manufacturer: [place]   Lot: [id number]   [place]: [id number]

## 2020-01-08 ENCOUNTER — Encounter: Payer: Self-pay | Admitting: Family Medicine

## 2020-01-08 NOTE — Progress Notes (Signed)
Patient: Mike Spence Male    DOB: 1946-04-18   74 y.o.   MRN: [id number] Visit Date: 01/08/2020  Today's Provider: [name], PA   Chief Complaint  Patient presents with  . Hyperlipidemia  . Hypertension   Subjective:     HPI   The patient is a 74 year old male who presents via electronic device to follow up on his hypertension and hyperlipidemia.  He was last seen on 05/05/19.  His last labs were done in February 2020.  BP Readings from Last 3 Encounters:  05/05/19 121/71  01/30/19 (!) 142/78  01/14/19 (!) 144/78   Lab Results  Component Value Date   CHOL 237 (H) 01/30/2019   HDL 81 01/30/2019   LDLCALC 144 (H) 01/30/2019   TRIG 58 01/30/2019   CHOLHDL 2.9 01/30/2019   Virtual Visit via Video Note  I connected with Mike Spence on 01/08/20 at  8:20 AM EST by a video enabled telemedicine application and verified that I am speaking with the correct person using two identifiers.  Location: Patient: home Provider: office   I discussed the limitations of evaluation and management by telemedicine and the availability of in person appointments. The patient expressed understanding and agreed to proceed.  Past Medical History:  Diagnosis Date  . Anxiety   . Dysrhythmia   . GERD (gastroesophageal reflux disease)   . Hypertension   . Migraines   . Palpitations    Hx of them- identified as PVC's. did a holter monitor in 2/10 showing frequent PVC's. Additionally, the patient had an echcardiogram done in 2/10. EF 55%. there was some abnormal septal motion that may have been due to frequent PVC's. there was no significant valvular disorder and the RV was normal in size and function.   Past Surgical History:  Procedure Laterality Date  . COLONOSCOPY WITH PROPOFOL N/A 07/06/2016   Procedure: COLONOSCOPY WITH PROPOFOL;  Surgeon: Manya Silvas, MD;  Location: [facility] ENDOSCOPY;  Service: Endoscopy;  Laterality: N/A;  . ESOPHAGOGASTRODUODENOSCOPY (EGD) WITH  PROPOFOL N/A 07/06/2016   Procedure: ESOPHAGOGASTRODUODENOSCOPY (EGD) WITH PROPOFOL;  Surgeon: Manya Silvas, MD;  Location: [facility] ENDOSCOPY;  Service: Endoscopy;  Laterality: N/A;  . RETINAL DETACHMENT SURGERY  2008  . TONSILLECTOMY AND ADENOIDECTOMY  1950's  . VARICOSE VEIN SURGERY  2013   Family History  Problem Relation Age of Onset  . Hypertension Mother   . Dementia Mother   . Colon cancer Mother   . Kidney disease Father   . Hypertension Father   . Diabetes Father   . Dementia Father   . Colon cancer Sister   . Mitral valve prolapse Daughter   . Anxiety disorder Daughter   . Ulcerative colitis Son   . Heart attack Maternal Grandfather   . Colon cancer Paternal Grandmother   . CVA Paternal Grandfather   . Coronary artery disease Neg Hx     Allergies  Allergen Reactions  . Aspirin     REACTION: upsets stomach  . Gluten Meal Other (See Comments)    Current Outpatient Medications:  .  omeprazole (PRILOSEC) 20 MG capsule, Take 20 mg by mouth daily. , Disp: , Rfl:  .  vitamin B-12 (CYANOCOBALAMIN) 250 MCG tablet, Take 250 mcg by mouth daily. , Disp: , Rfl:   Review of Systems  Constitutional: Negative.   HENT: Negative.   Respiratory: Negative.   Cardiovascular: Negative.   Gastrointestinal:       Dyspepsia with history  of Barrett's esophagitis.  Genitourinary: Negative.   Neurological: Positive for headaches.   Social History   Tobacco Use  . Smoking status: Former Smoker    Years: 1.00    Types: Cigarettes  . Smokeless tobacco: Never Used  . Tobacco comment: >50 years ago in college  Substance Use Topics  . Alcohol use: No    Alcohol/week: 0.0 standard drinks      Objective:   BP 120/80  There is no height or weight on file to calculate BMI.  WDWN male in no apparent distress.  Head: Normocephalic, atraumatic. Neck: Supple, NROM Respiratory: No apparent distress Psych: Normal mood and affect.     Assessment & Plan     1. Anxiety,  generalized Last refill of Lorazepam was in February 2018. Uses it at night when feeling anxious and it help with sleep. Also, has some help with "migraines" that seem to occur every 7-8 days and are worse in the winter. Last headache was 3 days ago with some vomiting. Average migraine starts at noon and will last 12 hours. Will refill Lorazepam for evening use to help with sleep and anxiety. Schedule for follow up labs and recheck pending reports. - CBC with Differential/Platelet - TSH - LORazepam (ATIVAN) 1 MG tablet; Take 1 tablet (1 mg total) by mouth at bedtime. As needed.  Dispense: 30 tablet; Refill: 1  2. Essential (primary) hypertension BP always better at home or when away from this office. Does not smoke, get much caffeine or ETOH. Will recheck routine labs and should continue swimming for exercise. Will continue to restrict salt intake. - CBC with Differential/Platelet - Comprehensive metabolic panel - Lipid Panel With LDL/HDL Ratio - TSH  3. History of Barrett's esophagus Followed by Dr.Toledo (GI) and continues to use Prilosec 20 mg qd for control of dyspepsia. Recheck labs. - CBC with Differential/Platelet - Comprehensive metabolic panel  4. High grade prostatic intraepithelial neoplasia Followed by Dr. Bernardo Heater (urology). Presently on surveillance with no abnormalities that were suspicious for high-grade prostate cancer on MRI in 2020. Had positive biopsy in 2019. Will recheck labs for follow up of renal function. - Comprehensive metabolic panel   HPI, Exam and A&P Transcribed under the direction and in the presence of Dennis E. Chrismon, PA-C Electronically Signed: Althea Charon, RMA     I discussed the assessment and treatment plan with the patient. The patient was provided an opportunity to ask questions and all were answered. The patient agreed with the plan and demonstrated an understanding of the instructions.   The patient was advised to call back or seek an  in-person evaluation if the symptoms worsen or if the condition fails to improve as anticipated.  I provided 15 minutes of non-face-to-face time during this encounter.   [name], PA  [facility]

## 2020-01-08 NOTE — Telephone Encounter (Signed)
Error

## 2020-01-09 ENCOUNTER — Telehealth: Payer: PPO | Admitting: Family Medicine

## 2020-01-09 ENCOUNTER — Encounter: Payer: Self-pay | Admitting: Family Medicine

## 2020-01-09 ENCOUNTER — Ambulatory Visit (INDEPENDENT_AMBULATORY_CARE_PROVIDER_SITE_OTHER): Payer: PPO | Admitting: Family Medicine

## 2020-01-09 VITALS — BP 120/80

## 2020-01-09 DIAGNOSIS — I10 Essential (primary) hypertension: Secondary | ICD-10-CM

## 2020-01-09 DIAGNOSIS — N4231 Prostatic intraepithelial neoplasia: Secondary | ICD-10-CM | POA: Diagnosis not present

## 2020-01-09 DIAGNOSIS — Z8719 Personal history of other diseases of the digestive system: Secondary | ICD-10-CM | POA: Diagnosis not present

## 2020-01-09 DIAGNOSIS — F411 Generalized anxiety disorder: Secondary | ICD-10-CM | POA: Diagnosis not present

## 2020-01-09 MED ORDER — LORAZEPAM 1 MG PO TABS: 1.0000 mg | ORAL_TABLET | Freq: Every day | ORAL | 1 refills | Status: DC

## 2020-01-19 ENCOUNTER — Other Ambulatory Visit
Admission: RE | Admit: 2020-01-19 | Discharge: 2020-01-19 | Disposition: A | Discharge status: Home / Self Care | Payer: PPO | Source: Ambulatory Visit | Attending: Internal Medicine | Admitting: Internal Medicine

## 2020-01-19 ENCOUNTER — Other Ambulatory Visit: Payer: Self-pay

## 2020-01-19 DIAGNOSIS — Z01812 Encounter for preprocedural laboratory examination: Secondary | ICD-10-CM | POA: Diagnosis not present

## 2020-01-19 DIAGNOSIS — Z20822 Contact with and (suspected) exposure to covid-19: Secondary | ICD-10-CM | POA: Diagnosis not present

## 2020-01-20 ENCOUNTER — Other Ambulatory Visit: Payer: PPO

## 2020-01-20 LAB — SARS CORONAVIRUS 2 (TAT 6-24 HRS): SARS Coronavirus 2: NEGATIVE

## 2020-01-21 ENCOUNTER — Encounter: Payer: Self-pay | Admitting: Internal Medicine

## 2020-01-22 ENCOUNTER — Encounter
Admission: RE | Disposition: A | Discharge status: Home / Self Care | Payer: Self-pay | Source: Home / Self Care | Attending: Internal Medicine

## 2020-01-22 ENCOUNTER — Ambulatory Visit: Payer: PPO

## 2020-01-22 ENCOUNTER — Encounter: Payer: Self-pay | Admitting: Internal Medicine

## 2020-01-22 ENCOUNTER — Ambulatory Visit: Payer: PPO | Admitting: Certified Registered"

## 2020-01-22 ENCOUNTER — Ambulatory Visit
Admission: RE | Admit: 2020-01-22 | Discharge: 2020-01-22 | Disposition: A | Discharge status: Home / Self Care | Payer: PPO | Attending: Internal Medicine | Admitting: Internal Medicine

## 2020-01-22 DIAGNOSIS — Z86010 Personal history of colonic polyps: Secondary | ICD-10-CM | POA: Diagnosis not present

## 2020-01-22 DIAGNOSIS — K640 First degree hemorrhoids: Secondary | ICD-10-CM | POA: Insufficient documentation

## 2020-01-22 DIAGNOSIS — I10 Essential (primary) hypertension: Secondary | ICD-10-CM | POA: Insufficient documentation

## 2020-01-22 DIAGNOSIS — Z886 Allergy status to analgesic agent status: Secondary | ICD-10-CM | POA: Insufficient documentation

## 2020-01-22 DIAGNOSIS — Z1211 Encounter for screening for malignant neoplasm of colon: Secondary | ICD-10-CM | POA: Diagnosis not present

## 2020-01-22 DIAGNOSIS — Z87891 Personal history of nicotine dependence: Secondary | ICD-10-CM | POA: Diagnosis not present

## 2020-01-22 DIAGNOSIS — Z79899 Other long term (current) drug therapy: Secondary | ICD-10-CM | POA: Insufficient documentation

## 2020-01-22 DIAGNOSIS — K219 Gastro-esophageal reflux disease without esophagitis: Secondary | ICD-10-CM | POA: Diagnosis not present

## 2020-01-22 DIAGNOSIS — K648 Other hemorrhoids: Secondary | ICD-10-CM | POA: Diagnosis not present

## 2020-01-22 DIAGNOSIS — C159 Malignant neoplasm of esophagus, unspecified: Secondary | ICD-10-CM | POA: Diagnosis not present

## 2020-01-22 HISTORY — DX: Other intervertebral disc displacement, lumbosacral region: M51.27

## 2020-01-22 HISTORY — DX: Benign neoplasm of colon, unspecified: D12.6

## 2020-01-22 HISTORY — DX: Diaphragmatic hernia without obstruction or gangrene: K44.9

## 2020-01-22 HISTORY — DX: Barrett's esophagus without dysplasia: K22.70

## 2020-01-22 HISTORY — DX: Asymptomatic varicose veins of unspecified lower extremity: I83.90

## 2020-01-22 HISTORY — PX: COLONOSCOPY WITH PROPOFOL: SHX5780

## 2020-01-22 HISTORY — DX: Polyneuropathy, unspecified: G62.9

## 2020-01-22 SURGERY — COLONOSCOPY WITH PROPOFOL
Anesthesia: General

## 2020-01-22 MED ORDER — PROPOFOL 10 MG/ML IV BOLUS
INTRAVENOUS | Status: DC | PRN
  Administered 2020-01-22: 10 mg via INTRAVENOUS
  Administered 2020-01-22: 70 mg via INTRAVENOUS

## 2020-01-22 MED ORDER — PHENYLEPHRINE HCL (PRESSORS) 10 MG/ML IV SOLN
INTRAVENOUS | Status: AC
  Filled 2020-01-22: qty 1

## 2020-01-22 MED ORDER — LIDOCAINE HCL (PF) 2 % IJ SOLN
INTRAMUSCULAR | Status: AC
  Filled 2020-01-22: qty 40

## 2020-01-22 MED ORDER — PROPOFOL 500 MG/50ML IV EMUL
INTRAVENOUS | Status: DC | PRN
  Administered 2020-01-22: 150 ug/kg/min via INTRAVENOUS

## 2020-01-22 MED ORDER — GLYCOPYRROLATE 0.2 MG/ML IJ SOLN
INTRAMUSCULAR | Status: AC
  Filled 2020-01-22: qty 3

## 2020-01-22 MED ORDER — EPHEDRINE SULFATE 50 MG/ML IJ SOLN
INTRAMUSCULAR | Status: AC
  Filled 2020-01-22: qty 2

## 2020-01-22 MED ORDER — LIDOCAINE HCL (CARDIAC) PF 100 MG/5ML IV SOSY
PREFILLED_SYRINGE | INTRAVENOUS | Status: DC | PRN
  Administered 2020-01-22: 60 mg via INTRATRACHEAL

## 2020-01-22 MED ORDER — SODIUM CHLORIDE 0.9 % IV SOLN: INTRAVENOUS | Status: DC

## 2020-01-22 NOTE — Interval H&P Note (Signed)
History and Physical Interval Note:  01/22/2020 8:57 AM  Mike Spence  has presented today for surgery, with the diagnosis of PERSONAL HX OF COLON POLYPS.  The various methods of treatment have been discussed with the patient and family. After consideration of risks, benefits and other options for treatment, the patient has consented to  Procedure(s): COLONOSCOPY WITH PROPOFOL (N/A) as a surgical intervention.  The patient's history has been reviewed, patient examined, no change in status, stable for surgery.  I have reviewed the patient's chart and labs.  Questions were answered to the patient's satisfaction.     Tiro, Keyes

## 2020-01-22 NOTE — Anesthesia Postprocedure Evaluation (Signed)
Anesthesia Post Note  Patient: Mike Spence  Procedure(s) Performed: COLONOSCOPY WITH PROPOFOL (N/A )  Patient location during evaluation: Endoscopy Anesthesia Type: General Level of consciousness: awake and alert Pain management: pain level controlled Vital Signs Assessment: post-procedure vital signs reviewed and stable Respiratory status: spontaneous breathing, nonlabored ventilation, respiratory function stable and patient connected to nasal cannula oxygen Cardiovascular status: blood pressure returned to baseline and stable Postop Assessment: no apparent nausea or vomiting Anesthetic complications: no     Last Vitals:  Vitals:   01/22/20 0943 01/22/20 0953  BP: 134/81 (!) 144/81  Pulse: 65 75  Resp: 16 14  Temp:    SpO2: 100% 100%    Last Pain:  Vitals:   01/22/20 0953  TempSrc:   PainSc: 0-No pain                 Martha Clan

## 2020-01-22 NOTE — Anesthesia Preprocedure Evaluation (Signed)
Anesthesia Evaluation  Patient identified by MRN, date of birth, ID band Patient awake    Reviewed: Allergy & Precautions, H&P , NPO status , Patient's Chart, lab work & pertinent test results, reviewed documented beta blocker date and time   History of Anesthesia Complications Negative for: history of anesthetic complications  Airway Mallampati: I  TM Distance: >3 FB Neck ROM: full    Dental  (+) Caps, Teeth Intact, Dental Advidsory Given   Pulmonary neg pulmonary ROS, former smoker,    Pulmonary exam normal breath sounds clear to auscultation       Cardiovascular Exercise Tolerance: Good hypertension, (-) angina+ Peripheral Vascular Disease  (-) CAD, (-) Past MI, (-) Cardiac Stents and (-) CABG Normal cardiovascular exam+ dysrhythmias (PVCs) (-) Valvular Problems/Murmurs Rhythm:regular Rate:Normal     Neuro/Psych PSYCHIATRIC DISORDERS (Anxiety) negative neurological ROS     GI/Hepatic Neg liver ROS, GERD  ,  Endo/Other  negative endocrine ROS  Renal/GU negative Renal ROS  negative genitourinary   Musculoskeletal   Abdominal   Peds  Hematology negative hematology ROS (+)   Anesthesia Other Findings Past Medical History: No date: Anxiety No date: Dysrhythmia No date: GERD (gastroesophageal reflux disease) No date: Hypertension No date: Palpitations     Comment: Hx of them- identified as PVC's. did a holter               monitor in 2/10 showing frequent PVC's.               Additionally, the patient had an echcardiogram               done in 2/10. EF 55%. there was some abnormal               septal motion that may have been due to               frequent PVC's. there was no significant               valvular disorder and the RV was normal in size              and function.   Reproductive/Obstetrics negative OB ROS                             Anesthesia Physical  Anesthesia  Plan  ASA: II  Anesthesia Plan: General   Post-op Pain Management:    Induction: Intravenous  PONV Risk Score and Plan: 1 and Propofol infusion and TIVA  Airway Management Planned: Natural Airway and Nasal Cannula  Additional Equipment:   Intra-op Plan:   Post-operative Plan:   Informed Consent: I have reviewed the patients History and Physical, chart, labs and discussed the procedure including the risks, benefits and alternatives for the proposed anesthesia with the patient or authorized representative who has indicated his/her understanding and acceptance.     Dental Advisory Given  Plan Discussed with: Anesthesiologist, CRNA and Surgeon  Anesthesia Plan Comments:         Anesthesia Quick Evaluation

## 2020-01-22 NOTE — Transfer of Care (Signed)
Immediate Anesthesia Transfer of Care Note  Patient: Mike Spence  Procedure(s) Performed: COLONOSCOPY WITH PROPOFOL (N/A )  Patient Location: Endoscopy Unit  Anesthesia Type:General  Level of Consciousness: drowsy and responds to stimulation  Airway & Oxygen Therapy: Patient Spontanous Breathing and Patient connected to face mask oxygen  Post-op Assessment: Report given to RN and Post -op Vital signs reviewed and stable  Post vital signs: Reviewed and stable  Last Vitals:  Vitals Value Taken Time  BP    Temp    Pulse    Resp    SpO2      Last Pain:  Vitals:   01/22/20 0812  TempSrc: Temporal  PainSc: 0-No pain         Complications: No apparent anesthesia complications

## 2020-01-22 NOTE — H&P (Signed)
Outpatient short stay form Pre-procedure 01/22/2020 8:55 AM Mike Spence K. Alice Reichert, M.D.  Primary Physician: [name], PA  Reason for visit:  GERD, Barrett's esophagus, Personal hx of sessile serrated adenoma, tubular adenoma (07/06/2016 Colonoscopy).  History of present illness:  Pleasant 74 y/o male presents for esophageal malignancy surveillance of Barrett's esophagus, presumed short segment. Biopsies obtained of the GE junction on 07/06/2016 were negative for Barrett's at follow up, however.                           Patient presents for colonoscopy for a personal hx of colon polyps. The patient denies abdominal pain, abnormal weight loss or rectal bleeding.      Current Facility-Administered Medications:  .  0.9 %  sodium chloride infusion, , Intravenous, Continuous, [place], Benay Pike, MD, Last Rate: 20 mL/hr at 01/22/20 0831, New Bag at 01/22/20 0831  Medications Prior to Admission  Medication Sig Dispense Refill Last Dose  . CHOLECALCIFEROL PO Take by mouth.   Not Taking at Unknown time  . LORazepam (ATIVAN) 1 MG tablet Take 1 tablet (1 mg total) by mouth at bedtime. As needed. (Patient not taking: Reported on 01/22/2020) 30 tablet 1 Not Taking at Unknown time  . omeprazole (PRILOSEC) 20 MG capsule Take 20 mg by mouth daily.    01/20/2020  . vitamin B-12 (CYANOCOBALAMIN) 250 MCG tablet Take 250 mcg by mouth daily.    Not Taking at Unknown time  . vitamin E (VITAMIN E) 180 MG (400 UNITS) capsule Take 400 Units by mouth daily.   Not Taking at Unknown time     Allergies  Allergen Reactions  . Aspirin     REACTION: upsets stomach  . Gluten Meal Other (See Comments)     Past Medical History:  Diagnosis Date  . Anxiety   . Barrett's esophagus   . Colon adenomas   . Diaphragmatic hernia   . Dysrhythmia   . GERD (gastroesophageal reflux disease)   . Herniated nucleus pulposus of lumbosacral region   . Hypertension   . Migraines   . Palpitations    Hx of them- identified as  PVC's. did a holter monitor in 2/10 showing frequent PVC's. Additionally, the patient had an echcardiogram done in 2/10. EF 55%. there was some abnormal septal motion that may have been due to frequent PVC's. there was no significant valvular disorder and the RV was normal in size and function.  . Peripheral neuropathy   . Varicose vein of leg     Review of systems:  Otherwise negative.    Physical Exam  Gen: Alert, oriented. Appears stated age.  HEENT: [place]/AT. PERRLA. Lungs: CTA, no wheezes. CV: RR nl S1, S2. Abd: soft, benign, no masses. BS+ Ext: No edema. Pulses 2+    Planned procedures: Proceed with EGD and colonoscopy. The patient understands the nature of the planned procedure, indications, risks, alternatives and potential complications including but not limited to bleeding, infection, perforation, damage to internal organs and possible oversedation/side effects from anesthesia. The patient agrees and gives consent to proceed.  Please refer to procedure notes for findings, recommendations and patient disposition/instructions.     Mike Spence K. Alice Reichert, M.D. Gastroenterology 01/22/2020  8:55 AM

## 2020-01-22 NOTE — Op Note (Signed)
[facility] Gastroenterology Patient Name: Mike Spence Procedure Date: 01/22/2020 8:54 AM MRN: [id number] Account #: [id number] Date of Birth: Jun 21, 1946 Admit Type: Outpatient Age: 74 Room: [facility] ENDO ROOM 3 Gender: Male Note Status: Finalized Procedure:             Colonoscopy Indications:           Surveillance: Personal history of adenomatous polyps                         on last colonoscopy > 3 years ago Providers:             Lorie Apley K. Alice Reichert MD, MD Referring MD:          Vickki Muff. Chrismon, MD (Referring MD) Medicines:             Propofol per Anesthesia Complications:         No immediate complications. Procedure:             Pre-Anesthesia Assessment:                        - The risks and benefits of the procedure and the                         sedation options and risks were discussed with the                         patient. All questions were answered and informed                         consent was obtained.                        - Patient identification and proposed procedure were                         verified prior to the procedure by the nurse. The                         procedure was verified in the procedure room.                        - ASA Grade Assessment: III - A patient with severe                         systemic disease.                        - After reviewing the risks and benefits, the patient                         was deemed in satisfactory condition to undergo the                         procedure.                        After obtaining informed consent, the colonoscope was                         passed under direct  vision. Throughout the procedure,                         the patient's blood pressure, pulse, and oxygen                         saturations were monitored continuously. The                         Colonoscope was introduced through the anus and                         advanced to the the cecum, identified  by appendiceal                         orifice and ileocecal valve. The colonoscopy was                         performed without difficulty. The patient tolerated                         the procedure well. The quality of the bowel                         preparation was good. The ileocecal valve, appendiceal                         orifice, and rectum were photographed. Findings:      The perianal and digital rectal examinations were normal. Pertinent       negatives include normal sphincter tone and no palpable rectal lesions.      Non-bleeding internal hemorrhoids were found during retroflexion. The       hemorrhoids were Grade I (internal hemorrhoids that do not prolapse).      The exam was otherwise without abnormality on direct and retroflexion       views. Impression:            - Non-bleeding internal hemorrhoids.                        - The examination was otherwise normal on direct and                         retroflexion views.                        - No specimens collected. Recommendation:        - Patient has a contact number available for                         emergencies. The signs and symptoms of potential                         delayed complications were discussed with the patient.                         Return to normal activities tomorrow. Written                         discharge instructions were provided  to the patient.                        - Resume previous diet.                        - Continue present medications.                        - No repeat colonoscopy due to current age (66 years                         or older), the absence of advanced adenomas and the                         absence of colonic polyps.                        - Return to GI office PRN.                        - The findings and recommendations were discussed with                         the patient. Procedure Code(s):     --- Professional ---                        [id number],  Colorectal cancer screening; colonoscopy on                         individual at high risk Diagnosis Code(s):     --- Professional ---                        K64.0, First degree hemorrhoids                        Z86.010, Personal history of colonic polyps CPT copyright 2019 [employer]. All rights reserved. The codes documented in this report are preliminary and upon coder review may  be revised to meet current compliance requirements. Efrain Sella MD, MD 01/22/2020 9:21:34 AM This report has been signed electronically. Number of Addenda: 0 Note Initiated On: 01/22/2020 8:54 AM Scope Withdrawal Time: 0 hours 4 minutes 23 seconds  Total Procedure Duration: 0 hours 9 minutes 26 seconds  Estimated Blood Loss:  Estimated blood loss: none.      [facility]

## 2020-01-23 ENCOUNTER — Encounter: Payer: Self-pay | Admitting: *Deleted

## 2020-01-27 ENCOUNTER — Ambulatory Visit: Payer: PPO | Admitting: Gastroenterology

## 2020-01-29 NOTE — Progress Notes (Addendum)
Subjective:   Mike Spence is a 74 y.o. male who presents for Medicare Annual/Subsequent preventive examination.    This visit is being conducted through telemedicine due to the COVID-19 pandemic. This patient has given me verbal consent via doximity to conduct this visit, patient states they are participating from their home address. Some vital signs may be absent or patient reported.    Patient identification: identified by name, DOB, and current address  Review of Systems:  N/A  Cardiac Risk Factors include: advanced age (>79men, >41 women);male gender     Objective:    Vitals: There were no vitals taken for this visit.  There is no height or weight on file to calculate BMI. Unable to obtain vitals due to visit being conducted via telephonically.   Advanced Directives 02/02/2020 01/22/2020 01/14/2019 01/10/2018 01/03/2017 07/06/2016  Does Patient Have a Medical Advance Directive? Yes Yes Yes Yes Yes No  Type of Paramedic of [place];Living will Healthcare Power of [place];Living will [place];Living will [place];Living will -  Copy of [place] in Chart? No - copy requested - No - copy requested No - copy requested No - copy requested -  Would patient like information on creating a medical advance directive? - - - - - No - patient declined information    Tobacco Social History   Tobacco Use  Smoking Status Former Smoker   Years: 1.00   Types: Cigarettes  Smokeless Tobacco Never Used  Tobacco Comment   >50 years ago in college     Counseling given: Not Answered Comment: >50 years ago in college   Clinical Intake:  Pre-visit preparation completed: Yes  Pain : No/denies pain Pain Score: 0-No pain     Nutritional Risks: None Diabetes: No  How often do you need to have someone help you when you read instructions, pamphlets, or other written materials from  your doctor or pharmacy?: 1 - Never  Interpreter Needed?: No  Information entered by :: [facility], LPN  Past Medical History:  Diagnosis Date   Anxiety    Barrett's esophagus    Colon adenomas    Diaphragmatic hernia    Dysrhythmia    GERD (gastroesophageal reflux disease)    Herniated nucleus pulposus of lumbosacral region    Hypertension    Migraines    Palpitations    Hx of them- identified as PVC's. did a holter monitor in 2/10 showing frequent PVC's. Additionally, the patient had an echcardiogram done in 2/10. EF 55%. there was some abnormal septal motion that may have been due to frequent PVC's. there was no significant valvular disorder and the RV was normal in size and function.   Peripheral neuropathy    Varicose vein of leg    Past Surgical History:  Procedure Laterality Date   COLONOSCOPY WITH PROPOFOL N/A 07/06/2016   Procedure: COLONOSCOPY WITH PROPOFOL;  Surgeon: Manya Silvas, MD;  Location: [facility] ENDOSCOPY;  Service: Endoscopy;  Laterality: N/A;   COLONOSCOPY WITH PROPOFOL N/A 01/22/2020   Procedure: COLONOSCOPY WITH PROPOFOL;  Surgeon: Toledo, Benay Pike, MD;  Location: [facility] ENDOSCOPY;  Service: Gastroenterology;  Laterality: N/A;   ESOPHAGOGASTRODUODENOSCOPY (EGD) WITH PROPOFOL N/A 07/06/2016   Procedure: ESOPHAGOGASTRODUODENOSCOPY (EGD) WITH PROPOFOL;  Surgeon: Manya Silvas, MD;  Location[facility] END[facility]COPY;  Service: Endoscopy;  Laterality: N/A;   RETINAL DETACHMENT SURGERY  2008   TONSILLECTOMY AND ADENOIDECTOMY  1950's   VARICOSE VEIN SURGERY  2013   Family History  Problem Relation Age of Onset   Hypertension Mother    Dementia Mother    Colon cancer Mother    Kidney disease Father    Hypertension Father    Diabetes Father    Dementia Father    Colon cancer Sister    Mitral valve prolapse Daughter    Anxiety disorder Daughter    Ulcerative colitis Son    Heart attack Maternal Grandfather    Colon cancer Paternal Grandmother    CVA Paternal  Grandfather    Coronary artery disease Neg Hx    Social History   Socioeconomic History   Marital status: Married    Spouse name: Not on file   Number of children: 2   Years of education: Not on file   Highest education level: Bachelor's degree (e.g., BA, AB, BS)  Occupational History   Occupation: retired  Tobacco Use   Smoking status: Former Smoker    Years: 1.00    Types: Cigarettes   Smokeless tobacco: Never Used   Tobacco comment: >50 years ago in college  Substance and Sexual Activity   Alcohol use: Yes    Alcohol/week: 7.0 standard drinks    Types: 7 Glasses of wine per week    Comment: 2 oz   Drug use: No   Sexual activity: Not Currently  Other Topics Concern   Not on file  Social History Narrative   Married and lives in [place] with wife. Works for Doctor, general practice.    Social Determinants of Health   Financial Resource Strain: Low Risk    Difficulty of Paying Living Expenses: Not hard at all  Food Insecurity: No Food Insecurity   Worried About Charity fundraiser in the Last Year: Never true   Arboriculturist in the Last Year: Never true  Transportation Needs: No Transportation Needs   Lack of Transportation (Medical): No   Lack of Transportation (Non-Medical): No  Physical Activity: Sufficiently Active   Days of Exercise per Week: 6 days   Minutes of Exercise per Session: 30 min  Stress: No Stress Concern Present   Feeling of Stress : Only a little  Social Connections: Slightly Isolated   Frequency of Communication with Friends and Family: Twice a week   Frequency of Social Gatherings with Friends and Family: Once a week   Attends Religious Services: More than 4 times per year   Active Member of [employer] or Organizations: No   Attends Archivist Meetings: Never   Marital Status: Married    Outpatient Encounter Medications as of 02/02/2020  Medication Sig   LORazepam (ATIVAN) 1 MG tablet Take 1 tablet (1 mg total) by mouth at bedtime. As needed.    omeprazole (PRILOSEC) 20 MG capsule Take 20 mg by mouth daily.    vitamin B-12 (CYANOCOBALAMIN) 250 MCG tablet Take 250 mcg by mouth daily.    CHOLECALCIFEROL PO Take by mouth.   vitamin E (VITAMIN E) 180 MG (400 UNITS) capsule Take 400 Units by mouth daily.   No facility-administered encounter medications on file as of 02/02/2020.    Activities of Daily Living In your present state of health, do you have any difficulty performing the following activities: 02/02/2020  Hearing? N  Vision? N  Difficulty concentrating or making decisions? N  Walking or climbing stairs? N  Dressing or bathing? N  Doing errands, shopping? N  Preparing Food and eating ? N  Using the Toilet? N  In  the past six months, have you accidently leaked urine? N  Do you have problems with loss of bowel control? N  Managing your Medications? N  Managing your Finances? N  Housekeeping or managing your Housekeeping? N  Some recent data might be hidden    Patient Care Team: Chrismon, Vickki Muff, PA as PCP - General (Physician Assistant) Birder Robson, MD as Referring Physician (Ophthalmology) Abbie Sons, MD (Urology) Efrain Sella, MD as Consulting Physician (Gastroenterology)   Assessment:   This is a routine wellness examination for Jayshun.  Exercise Activities and Dietary recommendations Current Exercise Habits: Home exercise routine, Type of exercise: walking;Other - see comments(rides a stationary bike), Time (Minutes): 35, Frequency (Times/Week): 6, Weekly Exercise (Minutes/Week): 210, Intensity: Mild, Exercise limited by: None identified  Goals      DIET - INCREASE WATER INTAKE     Recommend increasing water intake to 4-6 glasses a day.         Fall Risk: Fall Risk  02/02/2020 01/14/2019 01/10/2018 01/03/2017 09/20/2015  Falls in the past year? 0 0 No No No  Number falls in past yr: 0 - - - -  Injury with Fall? 0 - - - -    FALL RISK PREVENTION PERTAINING TO THE HOME:  Any stairs in or  around the home? Yes  If so, are there any without handrails? No   Home free of loose throw rugs in walkways, pet beds, electrical cords, etc? Yes  Adequate lighting in your home to reduce risk of falls? Yes   ASSISTIVE DEVICES UTILIZED TO PREVENT FALLS:  Life alert? No  Use of a cane, walker or w/c? No  Grab bars in the bathroom? No  Shower chair or bench in shower? No  Elevated toilet seat or a handicapped toilet? No   TIMED UP AND GO:  Was the test performed? No .    Depression Screen PHQ 2/9 Scores 02/02/2020 01/14/2019 01/14/2019 01/10/2018  PHQ - 2 Score 0 0 0 0  PHQ- 9 Score - 0 - 0    Cognitive Function: Declined today.     6CIT Screen 01/03/2017  What Year? 0 points  What month? 0 points  What time? 0 points  Count back from 20 0 points  Months in reverse 0 points  Repeat phrase 2 points  Total Score 2    Immunization History  Administered Date(s) Administered   Fluad Quad(high Dose 65+) 09/10/2019   Influenza, High Dose Seasonal PF 09/20/2015, 01/03/2017, 01/17/2018, 01/14/2019   PFIZER SARS-COV-2 Vaccination 12/15/2019, 01/04/2020   Pneumococcal Conjugate-13 05/07/2014   Pneumococcal Polysaccharide-23 09/20/2015   Tdap 07/04/2011    Qualifies for Shingles Vaccine? Yes . Due for Shingrix. Pt has been advised to call insurance company to determine out of pocket expense. Advised may also receive vaccine at local pharmacy or Health Dept. Verbalized acceptance and understanding.  Tdap: Up to date  Flu Vaccine: Up to date  Pneumococcal Vaccine: Completed series  Screening Tests Health Maintenance  Topic Date Due   TETANUS/TDAP  07/03/2021   COLONOSCOPY  01/21/2023   INFLUENZA VACCINE  Completed   Hepatitis C Screening  Completed   PNA vac Low Risk Adult  Completed   Cancer Screenings:  Colorectal Screening: Completed 07/06/16. Repeat every 3 years.   Lung Cancer Screening: (Low Dose CT Chest recommended if Age 17-80 years, 30 pack-year currently  smoking OR have quit w/in 15years.) does not qualify.   Additional Screening:  Hepatitis C Screening: Up to date  Vision Screening: Recommended annual ophthalmology exams for early detection of glaucoma and other disorders of the eye.  Dental Screening: Recommended annual dental exams for proper oral hygiene  Community Resource Referral:  CRR required this visit?  No        Plan:  I have personally reviewed and addressed the Medicare Annual Wellness questionnaire and have noted the following in the patient's chart:  A. Medical and social history B. Use of alcohol, tobacco or illicit drugs  C. Current medications and supplements D. Functional ability and status E.  Nutritional status F.  Physical activity G. Advance directives H. List of other physicians I.  Hospitalizations, surgeries, and ER visits in previous 12 months J.  [place] such as hearing and vision if needed, cognitive and depression L. Referrals and appointments   In addition, I have reviewed and discussed with patient certain preventive protocols, quality metrics, and best practice recommendations. A written personalized care plan for preventive services as well as general preventive health recommendations were provided to patient.   [place] Score, [place]  [id number] Nurse Health Advisor   Nurse Notes: None.  Reviewed Nurse Health Advisor's note and plan. Was available for consultation. Agree with documentation and recommendations.

## 2020-02-02 ENCOUNTER — Ambulatory Visit (INDEPENDENT_AMBULATORY_CARE_PROVIDER_SITE_OTHER): Payer: PPO

## 2020-02-02 ENCOUNTER — Other Ambulatory Visit: Payer: Self-pay

## 2020-02-02 DIAGNOSIS — Z0000 Encounter for general adult medical examination without abnormal findings: Secondary | ICD-10-CM | POA: Diagnosis not present

## 2020-02-02 NOTE — Patient Instructions (Signed)
Mike Spence , Thank you for taking time to come for your Medicare Wellness Visit. I appreciate your ongoing commitment to your health goals. Please review the following plan we discussed and let me know if I can assist you in the future.   Screening recommendations/referrals: Colonoscopy: Up to date, due 01/2023 Recommended yearly ophthalmology/optometry visit for glaucoma screening and checkup Recommended yearly dental visit for hygiene and checkup  Vaccinations: Influenza vaccine: Up to date Pneumococcal vaccine: Completed series Tdap vaccine: Up to date, due 06/2021 Shingles vaccine: Pt declines today.     Advanced directives: Please bring a copy of your POA (Power of Attorney) and/or Living Will to your next appointment.   Conditions/risks identified: Recommend to increase water intake to 6-8 8 oz glasses a day.  Next appointment: 02/06/20 @ 10:40 AM with [place] 76 Years and Older, Male Preventive care refers to lifestyle choices and visits with your health care provider that can promote health and wellness. What does preventive care include?  A yearly physical exam. This is also called an annual well check.  Dental exams once or twice a year.  Routine eye exams. Ask your health care provider how often you should have your eyes checked.  Personal lifestyle choices, including:  Daily care of your teeth and gums.  Regular physical activity.  Eating a healthy diet.  Avoiding tobacco and drug use.  Limiting alcohol use.  Practicing safe sex.  Taking low doses of aspirin every day.  Taking vitamin and mineral supplements as recommended by your health care provider. What happens during an annual well check? The services and screenings done by your health care provider during your annual well check will depend on your age, overall health, lifestyle risk factors, and family history of disease. Counseling  Your health care provider may ask you  questions about your:  Alcohol use.  Tobacco use.  Drug use.  Emotional well-being.  Home and relationship well-being.  Sexual activity.  Eating habits.  History of falls.  Memory and ability to understand (cognition).  Work and work Statistician. Screening  You may have the following tests or measurements:  Height, weight, and BMI.  Blood pressure.  Lipid and cholesterol levels. These may be checked every 5 years, or more frequently if you are over 61 years old.  Skin check.  Lung cancer screening. You may have this screening every year starting at age 12 if you have a 30-pack-year history of smoking and currently smoke or have quit within the past 15 years.  Fecal occult blood test (FOBT) of the stool. You may have this test every year starting at age 76.  Flexible sigmoidoscopy or colonoscopy. You may have a sigmoidoscopy every 5 years or a colonoscopy every 10 years starting at age 45.  Prostate cancer screening. Recommendations will vary depending on your family history and other risks.  Hepatitis C blood test.  Hepatitis B blood test.  Sexually transmitted disease (STD) testing.  Diabetes screening. This is done by checking your blood sugar (glucose) after you have not eaten for a while (fasting). You may have this done every 1-3 years.  Abdominal aortic aneurysm (AAA) screening. You may need this if you are a current or former smoker.  Osteoporosis. You may be screened starting at age 76 if you are at high risk. Talk with your health care provider about your test results, treatment options, and if necessary, the need for more tests. Vaccines  Your health care provider may recommend  certain vaccines, such as:  Influenza vaccine. This is recommended every year.  Tetanus, diphtheria, and acellular pertussis (Tdap, Td) vaccine. You may need a Td booster every 10 years.  Zoster vaccine. You may need this after age 52.  Pneumococcal 13-valent conjugate  (PCV13) vaccine. One dose is recommended after age 56.  Pneumococcal polysaccharide (PPSV23) vaccine. One dose is recommended after age 34. Talk to your health care provider about which screenings and vaccines you need and how often you need them. This information is not intended to replace advice given to you by your health care provider. Make sure you discuss any questions you have with your health care provider. Document Released: 12/17/2015 Document Revised: 08/09/2016 Document Reviewed: 09/21/2015 Elsevier Interactive Patient Education  2017 [employer] Prevention in the Home Falls can cause injuries. They can happen to people of all ages. There are many things you can do to make your home safe and to help prevent falls. What can I do on the outside of my home?  Regularly fix the edges of walkways and driveways and fix any cracks.  Remove anything that might make you trip as you walk through a door, such as a raised step or threshold.  Trim any bushes or trees on the path to your home.  Use bright outdoor lighting.  Clear any walking paths of anything that might make someone trip, such as rocks or tools.  Regularly check to see if handrails are loose or broken. Make sure that both sides of any steps have handrails.  Any raised decks and porches should have guardrails on the edges.  Have any leaves, snow, or ice cleared regularly.  Use sand or salt on walking paths during winter.  Clean up any spills in your garage right away. This includes oil or grease spills. What can I do in the bathroom?  Use night lights.  Install grab bars by the toilet and in the tub and shower. Do not use towel bars as grab bars.  Use non-skid mats or decals in the tub or shower.  If you need to sit down in the shower, use a plastic, non-slip stool.  Keep the floor dry. Clean up any water that spills on the floor as soon as it happens.  Remove soap buildup in the tub or shower  regularly.  Attach bath mats securely with double-sided non-slip rug tape.  Do not have throw rugs and other things on the floor that can make you trip. What can I do in the bedroom?  Use night lights.  Make sure that you have a light by your bed that is easy to reach.  Do not use any sheets or blankets that are too big for your bed. They should not hang down onto the floor.  Have a firm chair that has side arms. You can use this for support while you get dressed.  Do not have throw rugs and other things on the floor that can make you trip. What can I do in the kitchen?  Clean up any spills right away.  Avoid walking on wet floors.  Keep items that you use a lot in easy-to-reach places.  If you need to reach something above you, use a strong step stool that has a grab bar.  Keep electrical cords out of the way.  Do not use floor polish or wax that makes floors slippery. If you must use wax, use non-skid floor wax.  Do not have throw rugs and other  things on the floor that can make you trip. What can I do with my stairs?  Do not leave any items on the stairs.  Make sure that there are handrails on both sides of the stairs and use them. Fix handrails that are broken or loose. Make sure that handrails are as long as the stairways.  Check any carpeting to make sure that it is firmly attached to the stairs. Fix any carpet that is loose or worn.  Avoid having throw rugs at the top or bottom of the stairs. If you do have throw rugs, attach them to the floor with carpet tape.  Make sure that you have a light switch at the top of the stairs and the bottom of the stairs. If you do not have them, ask someone to add them for you. What else can I do to help prevent falls?  Wear shoes that:  Do not have high heels.  Have rubber bottoms.  Are comfortable and fit you well.  Are closed at the toe. Do not wear sandals.  If you use a stepladder:  Make sure that it is fully  opened. Do not climb a closed stepladder.  Make sure that both sides of the stepladder are locked into place.  Ask someone to hold it for you, if possible.  Clearly mark and make sure that you can see:  Any grab bars or handrails.  First and last steps.  Where the edge of each step is.  Use tools that help you move around (mobility aids) if they are needed. These include:  Canes.  Walkers.  Scooters.  Crutches.  Turn on the lights when you go into a dark area. Replace any light bulbs as soon as they burn out.  Set up your furniture so you have a clear path. Avoid moving your furniture around.  If any of your floors are uneven, fix them.  If there are any pets around you, be aware of where they are.  Review your medicines with your doctor. Some medicines can make you feel dizzy. This can increase your chance of falling. Ask your doctor what other things that you can do to help prevent falls. This information is not intended to replace advice given to you by your health care provider. Make sure you discuss any questions you have with your health care provider. Document Released: 09/16/2009 Document Revised: 04/27/2016 Document Reviewed: 12/25/2014 Elsevier Interactive Patient Education  2017 [employer].

## 2020-02-06 ENCOUNTER — Other Ambulatory Visit: Payer: Self-pay

## 2020-02-06 ENCOUNTER — Ambulatory Visit (INDEPENDENT_AMBULATORY_CARE_PROVIDER_SITE_OTHER): Payer: PPO | Admitting: Family Medicine

## 2020-02-06 ENCOUNTER — Encounter: Payer: Self-pay | Admitting: Family Medicine

## 2020-02-06 VITALS — BP 157/74 | HR 88 | Temp 97.3°F | Resp 16 | Ht 70.0 in | Wt 165.0 lb

## 2020-02-06 DIAGNOSIS — I10 Essential (primary) hypertension: Secondary | ICD-10-CM

## 2020-02-06 DIAGNOSIS — I8393 Asymptomatic varicose veins of bilateral lower extremities: Secondary | ICD-10-CM | POA: Diagnosis not present

## 2020-02-06 DIAGNOSIS — Z8719 Personal history of other diseases of the digestive system: Secondary | ICD-10-CM | POA: Diagnosis not present

## 2020-02-06 DIAGNOSIS — F411 Generalized anxiety disorder: Secondary | ICD-10-CM

## 2020-02-06 DIAGNOSIS — E7800 Pure hypercholesterolemia, unspecified: Secondary | ICD-10-CM | POA: Diagnosis not present

## 2020-02-06 DIAGNOSIS — N4231 Prostatic intraepithelial neoplasia: Secondary | ICD-10-CM

## 2020-02-06 DIAGNOSIS — Z86010 Personal history of colonic polyps: Secondary | ICD-10-CM

## 2020-02-06 NOTE — Progress Notes (Signed)
Patient: Mike Spence, Male    DOB: 11-30-1946, 74 y.o.   MRN: [id number] Visit Date: 02/06/2020  Today's Provider: [name], PA   Chief Complaint  Patient presents with  . Annual Exam   Subjective:     Patient had AWV with [facility] 02/02/2020.   Complete Physical Mike Spence is a 74 y.o. male. He feels fairly well. He reports exercising yes. He reports he is sleeping well.  -----------------------------------------------------------   Review of Systems  Neurological: Positive for headaches.  All other systems reviewed and are negative.   Social History   Socioeconomic History  . Marital status: Married    Spouse name: Not on file  . Number of children: 2  . Years of education: Not on file  . Highest education level: Bachelor's degree (e.g., BA, AB, BS)  Occupational History  . Occupation: retired  Tobacco Use  . Smoking status: Former Smoker    Years: 1.00    Types: Cigarettes  . Smokeless tobacco: Never Used  . Tobacco comment: >50 years ago in college  Substance and Sexual Activity  . Alcohol use: Yes    Alcohol/week: 7.0 standard drinks    Types: 7 Glasses of wine per week    Comment: 2 oz  . Drug use: No  . Sexual activity: Not Currently  Other Topics Concern  . Not on file  Social History Narrative   Married and lives in [place] with wife. Works for [employer], general practice.    Social Determinants of Health   Financial Resource Strain: Low Risk   . Difficulty of Paying Living Expenses: Not hard at all  Food Insecurity: No Food Insecurity  . Worried About Charity fundraiser in the Last Year: Never true  . Ran Out of Food in the Last Year: Never true  Transportation Needs: No Transportation Needs  . Lack of Transportation (Medical): No  . Lack of Transportation (Non-Medical): No  Physical Activity: Sufficiently Active  . Days of Exercise per Week: 6 days  . Minutes of Exercise per Session: 30 min  Stress: No Stress Concern Present  .  Feeling of Stress : Only a little  Social Connections: Slightly Isolated  . Frequency of Communication with Friends and Family: Twice a week  . Frequency of Social Gatherings with Friends and Family: Once a week  . Attends Religious Services: More than 4 times per year  . Active Member of Clubs or Organizations: No  . Attends Archivist Meetings: Never  . Marital Status: Married  Human resources officer Violence: Not At Risk  . Fear of Current or Ex-Partner: No  . Emotionally Abused: No  . Physically Abused: No  . Sexually Abused: No    Past Medical History:  Diagnosis Date  . Anxiety   . Barrett's esophagus   . Colon adenomas   . Diaphragmatic hernia   . Dysrhythmia   . GERD (gastroesophageal reflux disease)   . Herniated nucleus pulposus of lumbosacral region   . Hypertension   . Migraines   . Palpitations    Hx of them- identified as PVC's. did a holter monitor in 2/10 showing frequent PVC's. Additionally, the patient had an echcardiogram done in 2/10. EF 55%. there was some abnormal septal motion that may have been due to frequent PVC's. there was no significant valvular disorder and the RV was normal in size and function.  . Peripheral neuropathy   . Varicose vein of leg  Patient Active Problem List   Diagnosis Date Noted  . High grade prostatic intraepithelial neoplasia 07/01/2018  . Elevated PSA 05/08/2018  . History of Barrett's esophagus 09/28/2017  . Phlebectasia 09/20/2015  . Acid reflux 09/20/2015  . Anxiety, generalized 09/15/2015  . Personal history of tobacco use, presenting hazards to health 09/15/2015  . Anterior optic neuritis 09/15/2015  . HYPERTENSION, BENIGN 10/26/2010  . PALPITATIONS 10/26/2010  . Barrett esophagus 06/16/2008  . Leg varices 06/04/2007  . Diaphragmatic hernia 06/01/2006  . Esophagitis, reflux 06/01/2006  . Essential (primary) hypertension 06/01/2006    Past Surgical History:  Procedure Laterality Date  . COLONOSCOPY  WITH PROPOFOL N/A 07/06/2016   Procedure: COLONOSCOPY WITH PROPOFOL;  Surgeon: Manya Silvas, MD;  Location[facility]COPY;  Service: Endoscopy;  Laterality: N/A;  . COLONOSCOPY WITH PROPOFOL N/A 01/22/2020   Procedure: COLONOSCOPY WITH PROPOFOL;  Surgeon: Toledo, Benay Pike, MD;  Location: [facility] ENDOSCOPY;  Service: Gastroenterology;  Laterality: N/A;  . ESOPHAGOGASTRODUODENOSCOPY (EGD) WITH PROPOFOL N/A 07/06/2016   Procedure: ESOPHAGOGASTRODUODENOSCOPY (EGD) WITH PROPOFOL;  Surgeon: Manya Silvas, MD;  Location: [facility] ENDOSCOPY;  Service: Endoscopy;  Laterality: N/A;  . RETINAL DETACHMENT SURGERY  2008  . TONSILLECTOMY AND ADENOIDECTOMY  1950's  . VARICOSE VEIN SURGERY  2013    His family history includes Anxiety disorder in his daughter; CVA in his paternal grandfather; Colon cancer in his mother, paternal grandmother, and sister; Dementia in his father and mother; Diabetes in his father; Heart attack in his maternal grandfather; Hypertension in his father and mother; Kidney disease in his father; Mitral valve prolapse in his daughter; Ulcerative colitis in his son. There is no history of Coronary artery disease.   Current Outpatient Medications:  .  CHOLECALCIFEROL PO, Take by mouth., Disp: , Rfl:  .  LORazepam (ATIVAN) 1 MG tablet, Take 1 tablet (1 mg total) by mouth at bedtime. As needed., Disp: 30 tablet, Rfl: 1 .  omeprazole (PRILOSEC) 20 MG capsule, Take 20 mg by mouth daily. , Disp: , Rfl:  .  vitamin B-12 (CYANOCOBALAMIN) 250 MCG tablet, Take 250 mcg by mouth daily. , Disp: , Rfl:  .  vitamin E (VITAMIN E) 180 MG (400 UNITS) capsule, Take 400 Units by mouth daily., Disp: , Rfl:   Patient Care Team: Skila Rollins, Vickki Muff, PA as PCP - General (Physician Assistant) Birder Robson, MD as Referring Physician (Ophthalmology) Abbie Sons, MD (Urology) Efrain Sella, MD as Consulting Physician (Gastroenterology)     Objective:    Vitals: BP (!) 157/74 (BP Location: Right  Arm, Patient Position: Sitting, Cuff Size: Large)   Pulse 88   Temp (!) 97.3 F (36.3 C) (Other (Comment))   Resp 16   Ht 5\' 10"  (1.778 m)   Wt 165 lb (74.8 kg)   SpO2 98%   BMI 23.68 kg/m   Physical Exam Constitutional:      Appearance: He is well-developed.  HENT:     Head: Normocephalic and atraumatic.     Right Ear: External ear normal.     Left Ear: External ear normal.     Nose: Nose normal.  Eyes:     General:        Right eye: No discharge.     Conjunctiva/sclera: Conjunctivae normal.     Pupils: Pupils are equal, round, and reactive to light.  Neck:     Thyroid: No thyromegaly.     Trachea: No tracheal deviation.  Cardiovascular:     Rate and Rhythm: Normal  rate and regular rhythm.     Heart sounds: Normal heart sounds. No murmur.  Pulmonary:     Effort: Pulmonary effort is normal. No respiratory distress.     Breath sounds: Normal breath sounds. No wheezing or rales.  Chest:     Chest wall: No tenderness.  Abdominal:     General: There is no distension.     Palpations: Abdomen is soft. There is no mass.     Tenderness: There is no abdominal tenderness. There is no guarding or rebound.  Genitourinary:    Comments: PSA elevation and enlarged prostate followed by Dr. Bernardo Heater (urologist). Musculoskeletal:        General: No tenderness. Normal range of motion.     Cervical back: Normal range of motion and neck supple.     Comments: Many large varicosities in the right leg - smaller in the left leg. Asymptomatic without ulcerations or edema.  Lymphadenopathy:     Cervical: No cervical adenopathy.  Skin:    General: Skin is warm and dry.     Findings: No erythema or rash.  Neurological:     Mental Status: He is alert and oriented to person, place, and time.     Cranial Nerves: No cranial nerve deficit.     Motor: No abnormal muscle tone.     Coordination: Coordination normal.     Deep Tendon Reflexes: Reflexes are normal and symmetric. Reflexes normal.   Psychiatric:        Behavior: Behavior normal.        Thought Content: Thought content normal.        Judgment: Judgment normal.     Activities of Daily Living In your present state of health, do you have any difficulty performing the following activities: 02/02/2020  Hearing? N  Vision? N  Difficulty concentrating or making decisions? N  Walking or climbing stairs? N  Dressing or bathing? N  Doing errands, shopping? N  Preparing Food and eating ? N  Using the Toilet? N  In the past six months, have you accidently leaked urine? N  Do you have problems with loss of bowel control? N  Managing your Medications? N  Managing your Finances? N  Housekeeping or managing your Housekeeping? N  Some recent data might be hidden    Fall Risk Assessment Fall Risk  02/02/2020 01/14/2019 01/10/2018 01/03/2017 09/20/2015  Falls in the past year? 0 0 No No No  Number falls in past yr: 0 - - - -  Injury with Fall? 0 - - - -     Depression Screen PHQ 2/9 Scores 02/02/2020 01/14/2019 01/14/2019 01/10/2018  PHQ - 2 Score 0 0 0 0  PHQ- 9 Score - 0 - 0    6CIT Screen 01/03/2017  What Year? 0 points  What month? 0 points  What time? 0 points  Count back from 20 0 points  Months in reverse 0 points  Repeat phrase 2 points  Total Score 2       Assessment & Plan:    Annual Physical Reviewed patient's Family Medical History Reviewed and updated list of patient's medical providers Assessment of cognitive impairment was done Assessed patient's functional ability Established a written schedule for health screening [place] Completed and Reviewed  Exercise Activities and Dietary recommendations Goals    . DIET - INCREASE WATER INTAKE     Recommend increasing water intake to 4-6 glasses a day.        Immunization History  Administered Date(s) Administered  . Fluad Quad(high Dose 65+) 09/10/2019  . Influenza, High Dose Seasonal PF 09/20/2015, 01/03/2017, 01/17/2018,  01/14/2019  . PFIZER SARS-COV-2 Vaccination 12/15/2019, 01/04/2020  . Pneumococcal Conjugate-13 05/07/2014  . Pneumococcal Polysaccharide-23 09/20/2015  . Tdap 07/04/2011    Health Maintenance  Topic Date Due  . Samul Dada  07/03/2021  . COLONOSCOPY  01/21/2023  . INFLUENZA VACCINE  Completed  . Hepatitis C Screening  Completed  . PNA vac Low Risk Adult  Completed     Discussed health benefits of physical activity, and encouraged him to engage in regular exercise appropriate for his age and condition.    --------------------------------------------------------------------  1. Essential (primary) hypertension BP stable. Better reading away from the office -"White Coat Syndrome". Restricts salt and caffeine intake. No weight gain or use of tobacco. Recheck routine labs and follow up prn. Last colonoscopy by Dr. Alice Reichert (GI) on 01-22-20 did not show any polyps and will not have a repeat due to current age. Immunizations up to date. - CBC w/Diff/Platelet - Lipid panel - Comprehensive Metabolic Panel (CMET) - TSH  2. History of Barrett's esophagus Had upper endoscopy on 07-06-16 without changes in short segment Barrett's Esophagus per Dr. Vira Agar (GI). Presently using Omeprazole 10 mg qd prn any dyspepsia. No hematemesis, melena, hematochezia or abdominal pain.  3. Asymptomatic varicose veins of both lower extremities Multiple large varicose veins of both lower legs. No ulcerations or pitting edema. Recommend he use compression stockings and recheck prn.  4. Anxiety, generalized Unchanged without panic attacks. Rarely uses Lorazepam at bedtime. No depression or suicidal ideation. Recheck routine labs and follow up prn. - CBC w/Diff/Platelet - Comprehensive Metabolic Panel (CMET) - TSH  5. High grade prostatic intraepithelial neoplasia Followed by Dr. Bernardo Heater (urologist) and last PSA was 6.8 on 12-30-18.  6. Hypercholesterolemia Trying to follow low fat diet and exercises by  swimming daily. Weight stable with BMI 23.68. Will get follow up labs that weren't done in February 2021.  7. History of adenomatous polyp of colon Normal colonoscopy on 01-22-20 by Dr. Alice Reichert (GI) without polyp recurrences.   [name], PA  [facility]

## 2020-02-10 DIAGNOSIS — F411 Generalized anxiety disorder: Secondary | ICD-10-CM | POA: Diagnosis not present

## 2020-02-10 DIAGNOSIS — I10 Essential (primary) hypertension: Secondary | ICD-10-CM | POA: Diagnosis not present

## 2020-02-11 LAB — CBC WITH DIFFERENTIAL/PLATELET
Basophils Absolute: 0.1 10*3/uL (ref 0.0–0.2)
Basos: 1 %
EOS (ABSOLUTE): 0.1 10*3/uL (ref 0.0–0.4)
Eos: 1 %
Hematocrit: 39.7 % (ref 37.5–51.0)
Hemoglobin: 13.4 g/dL (ref 13.0–17.7)
Immature Grans (Abs): 0 10*3/uL (ref 0.0–0.1)
Immature Granulocytes: 0 %
Lymphocytes Absolute: 3.2 10*3/uL — ABNORMAL HIGH (ref 0.7–3.1)
Lymphs: 45 %
MCH: 33.3 pg — ABNORMAL HIGH (ref 26.6–33.0)
MCHC: 33.8 g/dL (ref 31.5–35.7)
MCV: 99 fL — ABNORMAL HIGH (ref 79–97)
Monocytes Absolute: 0.7 10*3/uL (ref 0.1–0.9)
Monocytes: 9 %
Neutrophils Absolute: 3.2 10*3/uL (ref 1.4–7.0)
Neutrophils: 44 %
Platelets: 203 10*3/uL (ref 150–450)
RBC: 4.02 x10E6/uL — ABNORMAL LOW (ref 4.14–5.80)
RDW: 12.2 % (ref 11.6–15.4)
WBC: 7.2 10*3/uL (ref 3.4–10.8)

## 2020-02-11 LAB — COMPREHENSIVE METABOLIC PANEL
ALT: 15 IU/L (ref 0–44)
AST: 17 IU/L (ref 0–40)
Albumin/Globulin Ratio: 1.8 (ref 1.2–2.2)
Albumin: 4.3 g/dL (ref 3.7–4.7)
Alkaline Phosphatase: 64 IU/L (ref 39–117)
BUN/Creatinine Ratio: 17 (ref 10–24)
BUN: 20 mg/dL (ref 8–27)
Bilirubin Total: 0.6 mg/dL (ref 0.0–1.2)
CO2: 24 mmol/L (ref 20–29)
Calcium: 9.4 mg/dL (ref 8.6–10.2)
Chloride: 103 mmol/L (ref 96–106)
Creatinine, Ser: 1.2 mg/dL (ref 0.76–1.27)
GFR calc Af Amer: 68 mL/min/{1.73_m2} (ref >59)
GFR calc non Af Amer: 59 mL/min/{1.73_m2} — ABNORMAL LOW (ref >59)
Globulin, Total: 2.4 g/dL (ref 1.5–4.5)
Glucose: 96 mg/dL (ref 65–99)
Potassium: 4.3 mmol/L (ref 3.5–5.2)
Sodium: 140 mmol/L (ref 134–144)
Total Protein: 6.7 g/dL (ref 6.0–8.5)

## 2020-02-11 LAB — TSH: TSH: 2.23 u[IU]/mL (ref 0.450–4.500)

## 2020-03-15 IMAGING — MR MR PROSTATE WO/W CM
56 series · 56 of 56 positions shown · IV contrast (Multihance 15ml)
Comparison: Biopsy results of 06/21/2018. This demonstrates
prostatic intraepithelial neoplasia within the left mid gland.

CLINICAL DATA: Elevated PSA.  Prior biopsy demonstrating PIN.

EXAM:
MR PROSTATE WITHOUT AND WITH CONTRAST
TECHNIQUE: Multiplanar multisequence MRI images were obtained of the pelvis
centered about the prostate. Pre and post contrast images were
obtained.
CONTRAST:  15mL MULTIHANCE GADOBENATE DIMEGLUMINE 529 MG/ML IV SOLN

[Series 3: T1 · axial · 8.0mm · 1.06mm/px · 1 of 28 slices shown (1 of 2)]
[im 1/28]
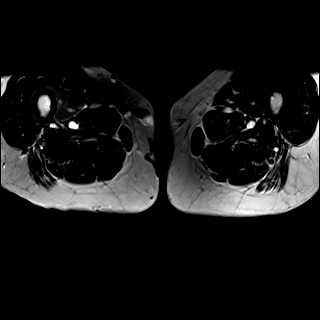

[Series 4: bSSFP fat-sat · axial · 8.0mm · 0.74mm/px · 1 of 28 slices shown]
[im 1/28]
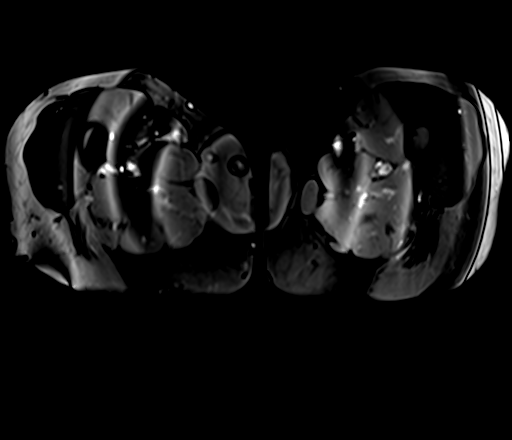

[Series 5: T2 · sagittal · 3.5mm · 0.56mm/px · 1 of 39 slices shown (1 of 4)]
[im 1/39]
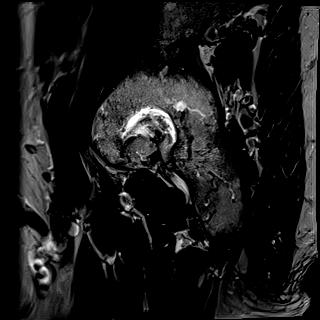

[Series 6: T1 · axial · 3.0mm · 0.31mm/px · 1 of 24 slices shown (2 of 2)]
[im 1/24]
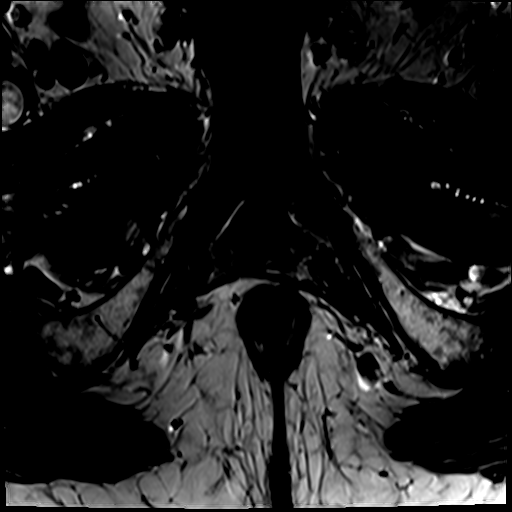

[Series 7: T2 · axial · 3.5mm · 0.56mm/px · 1 of 23 slices shown (2 of 4)]
[im 1/23]
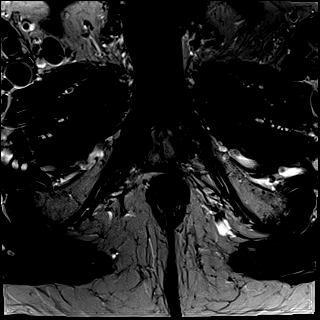

[Series 8: T2 · axial · 1.0mm · 1.04mm/px · 1 of 80 slices shown (3 of 4)]
[im 1/80]
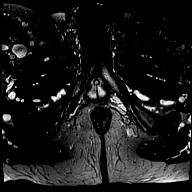

[Series 9: T2 · coronal · 3.5mm · 0.56mm/px · 1 of 23 slices shown (4 of 4)]
[im 1/23]
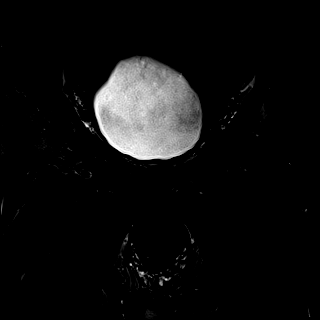

[Series 10: DWI · axial · 3.5mm · 1.56mm/px · 1 of 58 slices shown (1 of 2)]
[im 1/58]
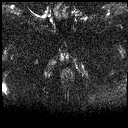

[Series 11: DWI · axial · 3.5mm · 1.56mm/px · 1 of 20 slices shown (2 of 2)]
[im 1/20]
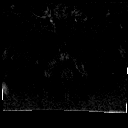

[Series 12: pre t1_twist_tra_dyn_ttc=5.3s · axial · non-contrast · 3.5mm · 0.83mm/px · 1 of 20 slices shown]
[im 1/20]
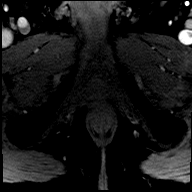

[Series 13: post t1_twist_tra_dyn-copy center · axial · 3.5mm · 0.83mm/px · 1 of 20 slices shown (1 of 24)]
[im 1/20]
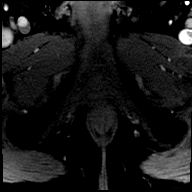

[Series 14: post t1_twist_tra_dyn-copy center · axial · 3.5mm · 0.83mm/px · 1 of 20 slices shown (2 of 24)]
[im 1/20]
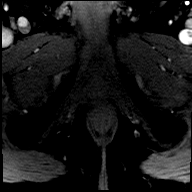

[Series 15: post t1_twist_tra_dyn-copy cent_sub_ttc=(id) · axial · 3.5mm · 0.83mm/px · 1 of 20 slices shown (1 of 22)]
[im 1/20]
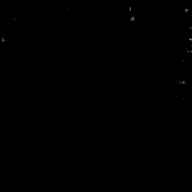

[Series 16: post t1_twist_tra_dyn-copy center · axial · 3.5mm · 0.83mm/px · 1 of 20 slices shown (3 of 24)]
[im 1/20]
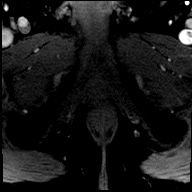

[Series 17: post t1_twist_tra_dyn-copy cent_sub_ttc=(id) · axial · 3.5mm · 0.83mm/px · 1 of 20 slices shown (2 of 22)]
[im 1/20]
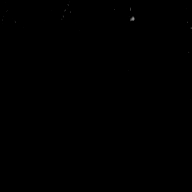

[Series 18: post t1_twist_tra_dyn-copy center · axial · 3.5mm · 0.83mm/px · 1 of 20 slices shown (4 of 24)]
[im 1/20]
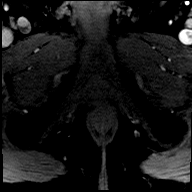

[Series 19: post t1_twist_tra_dyn-copy cent_sub_ttc=(id) · axial · 3.5mm · 0.83mm/px · 1 of 20 slices shown (3 of 22)]
[im 1/20]
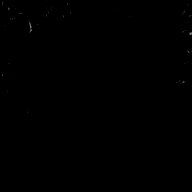

[Series 20: post t1_twist_tra_dyn-copy center · axial · 3.5mm · 0.83mm/px · 1 of 20 slices shown (5 of 24)]
[im 1/20]
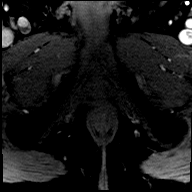

[Series 21: post t1_twist_tra_dyn-copy cent_sub_ttc=(id) · axial · 3.5mm · 0.83mm/px · 1 of 20 slices shown (4 of 22)]
[im 1/20]
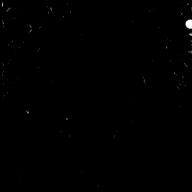

[Series 22: post t1_twist_tra_dyn-copy center · axial · 3.5mm · 0.83mm/px · 1 of 20 slices shown (6 of 24)]
[im 1/20]
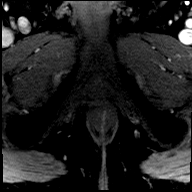

[Series 23: post t1_twist_tra_dyn-copy cent_sub_ttc=(id) · axial · 3.5mm · 0.83mm/px · 1 of 20 slices shown (5 of 22)]
[im 1/20]
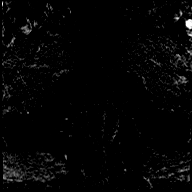

[Series 24: post t1_twist_tra_dyn-copy center · axial · 3.5mm · 0.83mm/px · 1 of 20 slices shown (7 of 24)]
[im 1/20]
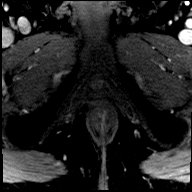

[Series 25: post t1_twist_tra_dyn-copy cent_sub_ttc=(id) · axial · 3.5mm · 0.83mm/px · 1 of 20 slices shown (6 of 22)]
[im 1/20]
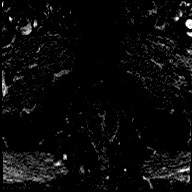

[Series 26: post t1_twist_tra_dyn-copy center · axial · 3.5mm · 0.83mm/px · 1 of 20 slices shown (8 of 24)]
[im 1/20]
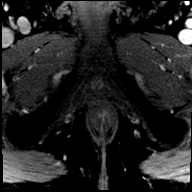

[Series 27: post t1_twist_tra_dyn-copy cent_sub_ttc=(id) · axial · 3.5mm · 0.83mm/px · 1 of 20 slices shown (7 of 22)]
[im 1/20]
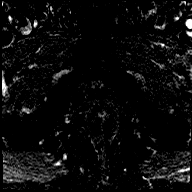

[Series 28: post t1_twist_tra_dyn-copy center · axial · 3.5mm · 0.83mm/px · 1 of 20 slices shown (9 of 24)]
[im 1/20]
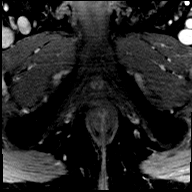

[Series 29: post t1_twist_tra_dyn-copy cent_sub_ttc=(id) · axial · 3.5mm · 0.83mm/px · 1 of 20 slices shown (8 of 22)]
[im 1/20]
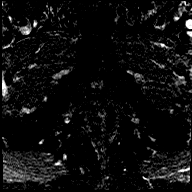

[Series 30: post t1_twist_tra_dyn-copy center · axial · 3.5mm · 0.83mm/px · 1 of 20 slices shown (10 of 24)]
[im 1/20]
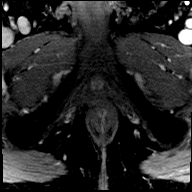

[Series 31: post t1_twist_tra_dyn-copy cent_sub_ttc=(id) · axial · 3.5mm · 0.83mm/px · 1 of 20 slices shown (9 of 22)]
[im 1/20]
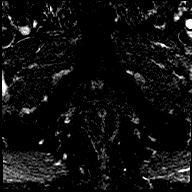

[Series 32: post t1_twist_tra_dyn-copy center · axial · 3.5mm · 0.83mm/px · 1 of 20 slices shown (11 of 24)]
[im 1/20]
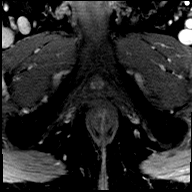

[Series 33: post t1_twist_tra_dyn-copy cent_sub_ttc=(id) · axial · 3.5mm · 0.83mm/px · 1 of 20 slices shown (10 of 22)]
[im 1/20]
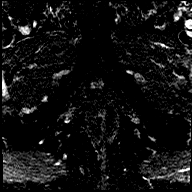

[Series 34: post t1_twist_tra_dyn-copy center · axial · 3.5mm · 0.83mm/px · 1 of 20 slices shown (12 of 24)]
[im 1/20]
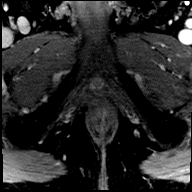

[Series 35: post t1_twist_tra_dyn-copy center · axial · 3.5mm · 0.83mm/px · 1 of 20 slices shown (13 of 24)]
[im 1/20]
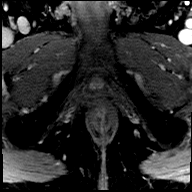

[Series 36: post t1_twist_tra_dyn-copy cent_sub_ttc=(id) · axial · 3.5mm · 0.83mm/px · 1 of 20 slices shown (11 of 22)]
[im 1/20]
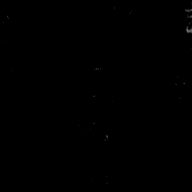

[Series 37: post t1_twist_tra_dyn-copy center · axial · 3.5mm · 0.83mm/px · 1 of 20 slices shown (14 of 24)]
[im 1/20]
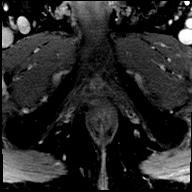

[Series 38: post t1_twist_tra_dyn-copy cent_sub_ttc=(id) · axial · 3.5mm · 0.83mm/px · 1 of 20 slices shown (12 of 22)]
[im 1/20]
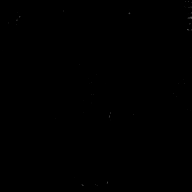

[Series 39: post t1_twist_tra_dyn-copy center · axial · 3.5mm · 0.83mm/px · 1 of 20 slices shown (15 of 24)]
[im 1/20]
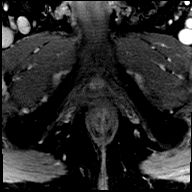

[Series 40: post t1_twist_tra_dyn-copy cent_sub_ttc=(id) · axial · 3.5mm · 0.83mm/px · 1 of 20 slices shown (13 of 22)]
[im 1/20]
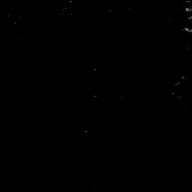

[Series 41: post t1_twist_tra_dyn-copy center · axial · 3.5mm · 0.83mm/px · 1 of 20 slices shown (16 of 24)]
[im 1/20]
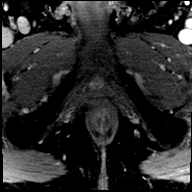

[Series 42: post t1_twist_tra_dyn-copy cent_sub_ttc=(id) · axial · 3.5mm · 0.83mm/px · 1 of 20 slices shown (14 of 22)]
[im 1/20]
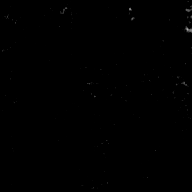

[Series 43: post t1_twist_tra_dyn-copy center · axial · 3.5mm · 0.83mm/px · 1 of 20 slices shown (17 of 24)]
[im 1/20]
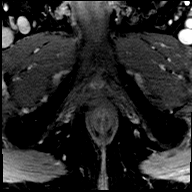

[Series 44: post t1_twist_tra_dyn-copy cent_sub_ttc=(id) · axial · 3.5mm · 0.83mm/px · 1 of 20 slices shown (15 of 22)]
[im 1/20]
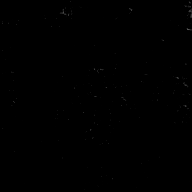

[Series 45: post t1_twist_tra_dyn-copy center · axial · 3.5mm · 0.83mm/px · 1 of 20 slices shown (18 of 24)]
[im 1/20]
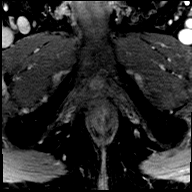

[Series 46: post t1_twist_tra_dyn-copy cent_sub_ttc=(id) · axial · 3.5mm · 0.83mm/px · 1 of 20 slices shown (16 of 22)]
[im 1/20]
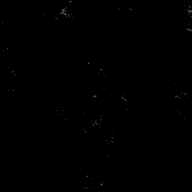

[Series 47: post t1_twist_tra_dyn-copy center · axial · 3.5mm · 0.83mm/px · 1 of 20 slices shown (19 of 24)]
[im 1/20]
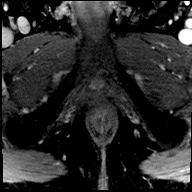

[Series 48: post t1_twist_tra_dyn-copy cent_sub_ttc=(id) · axial · 3.5mm · 0.83mm/px · 1 of 20 slices shown (17 of 22)]
[im 1/20]
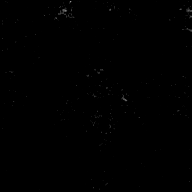

[Series 49: post t1_twist_tra_dyn-copy center · axial · 3.5mm · 0.83mm/px · 1 of 20 slices shown (20 of 24)]
[im 1/20]
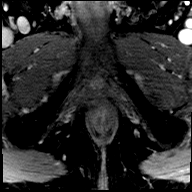

[Series 50: post t1_twist_tra_dyn-copy cent_sub_ttc=(id) · axial · 3.5mm · 0.83mm/px · 1 of 20 slices shown (18 of 22)]
[im 1/20]
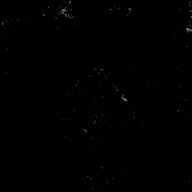

[Series 51: post t1_twist_tra_dyn-copy center · axial · 3.5mm · 0.83mm/px · 1 of 20 slices shown (21 of 24)]
[im 1/20]
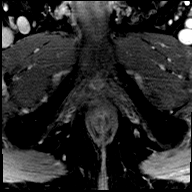

[Series 52: post t1_twist_tra_dyn-copy cent_sub_ttc=(id) · axial · 3.5mm · 0.83mm/px · 1 of 20 slices shown (19 of 22)]
[im 1/20]
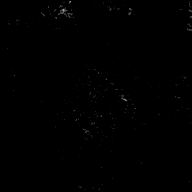

[Series 53: post t1_twist_tra_dyn-copy center · axial · 3.5mm · 0.83mm/px · 1 of 20 slices shown (22 of 24)]
[im 1/20]
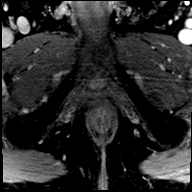

[Series 54: post t1_twist_tra_dyn-copy cent_sub_ttc=(id) · axial · 3.5mm · 0.83mm/px · 1 of 20 slices shown (20 of 22)]
[im 1/20]
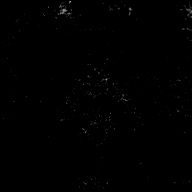

[Series 55: post t1_twist_tra_dyn-copy center · axial · 3.5mm · 0.83mm/px · 1 of 20 slices shown (23 of 24)]
[im 1/20]
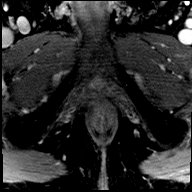

[Series 56: post t1_twist_tra_dyn-copy cent_sub_ttc=(id) · axial · 3.5mm · 0.83mm/px · 1 of 20 slices shown (21 of 22)]
[im 1/20]
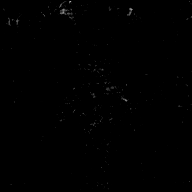

[Series 57: post t1_twist_tra_dyn-copy center · axial · 3.5mm · 0.83mm/px · 1 of 20 slices shown (24 of 24)]
[im 1/20]
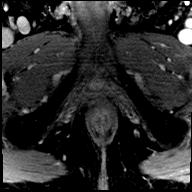

[Series 58: post t1_twist_tra_dyn-copy cent_sub_ttc=(id) · axial · 3.5mm · 0.83mm/px · 1 of 20 slices shown (22 of 22)]
[im 1/20]
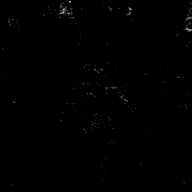

[56 of 56 positions shown; findings below may reference images not displayed]

FINDINGS: Prostate: Demonstrates mild central gland enlargement and
heterogeneity, consistent with benign prostatic hyperplasia. An
anterior right mid to apical central gland T2 hypointense nodule is
well-circumscribed and favored to represent a BPH nodule (
PI-RADS(v2.1)-2).

No areas of masslike T2 hypointensity, restricted diffusion, or
early post-contrast enhancement within the peripheral zone.

Volume: 3.6 x 5.2 x 3.6 cm (volume = 35 cm^3)

Transcapsular spread:  Absent

Seminal vesicle involvement: Absent

Neurovascular bundle involvement: Absent

Pelvic adenopathy: Absent

Bone metastasis: Absent

Other findings: No significant free fluid.  Normal urinary bladder.
IMPRESSION: 1. No evidence of macroscopic or high-grade prostate carcinoma.
2. No pelvic adenopathy or evidence of osseous metastasis.

## 2020-05-11 ENCOUNTER — Ambulatory Visit: Payer: PPO | Admitting: Family Medicine

## 2020-05-19 NOTE — Progress Notes (Signed)
Established patient visit   Patient: Mike Spence   DOB: February 05, 1946   74 y.o. Male  MRN: [id number] Visit Date: 05/20/2020  Today's healthcare provider: [name], PA   Chief Complaint  Patient presents with   Hypertension   Abnormal Lab   Subjective    HPI Hypertension, follow-up  BP Readings from Last 3 Encounters:  05/20/20 (!) 160/84  02/06/20 (!) 157/74  01/22/20 (!) 144/81   Wt Readings from Last 3 Encounters:  05/20/20 165 lb (74.8 kg)  02/06/20 165 lb (74.8 kg)  01/22/20 160 lb (72.6 kg)     He was last seen for hypertension 3 months ago.  BP at that visit was 157/74. Management since that visit includes no changes.  He reports excellent compliance with treatment. He is not having side effects.  He is following a Low Sodium diet. He is exercising. 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  Use of agents associated with hypertension: none.   Outside blood pressures are normal at home.  Pertinent labs: Lab Results  Component Value Date   CHOL 237 (H) 01/30/2019   HDL 81 01/30/2019   LDLCALC 144 (H) 01/30/2019   TRIG 58 01/30/2019   CHOLHDL 2.9 01/30/2019   Lab Results  Component Value Date   NA 140 02/10/2020   K 4.3 02/10/2020   CREATININE 1.20 02/10/2020   GFRNONAA 59 (L) 02/10/2020   GFRAA 68 02/10/2020   GLUCOSE 96 02/10/2020     The 10-year ASCVD risk score [name] DC Jr., et al., 2013) is: 31.4%   --------------------------------------------------------------------------------------------------- Follow up for abnormal labs  The patient was last seen for this 3 months ago. Changes made at last visit include starting a b-complex.  He reports excellent compliance with treatment. He is not having side effects.   -----------------------------------------------------------------------------------------   Patient Active Problem List   Diagnosis Date Noted   High grade prostatic intraepithelial neoplasia 07/01/2018   Elevated PSA 05/08/2018     History of Barrett's esophagus 09/28/2017   Phlebectasia 09/20/2015   Acid reflux 09/20/2015   Anxiety, generalized 09/15/2015   Personal history of tobacco use, presenting hazards to health 09/15/2015   Anterior optic neuritis 09/15/2015   HYPERTENSION, BENIGN 10/26/2010   PALPITATIONS 10/26/2010   Barrett esophagus 06/16/2008   Leg varices 06/04/2007   Diaphragmatic hernia 06/01/2006   Esophagitis, reflux 06/01/2006   Essential (primary) hypertension 06/01/2006   Past Medical History:  Diagnosis Date   Anxiety    Barrett's esophagus    Colon adenomas    Diaphragmatic hernia    Dysrhythmia    GERD (gastroesophageal reflux disease)    Herniated nucleus pulposus of lumbosacral region    Hypertension    Migraines    Palpitations    Hx of them- identified as PVC's. did a holter monitor in 2/10 showing frequent PVC's. Additionally, the patient had an echcardiogram done in 2/10. EF 55%. there was some abnormal septal motion that may have been due to frequent PVC's. there was no significant valvular disorder and the RV was normal in size and function.   Peripheral neuropathy    Varicose vein of leg    Past Surgical History:  Procedure Laterality Date   COLONOSCOPY WITH PROPOFOL N/A 07/06/2016   Procedure: COLONOSCOPY WITH PROPOFOL;  Surgeon: Manya Silvas, MD;  Location: [facility] ENDOSCOPY;  Service: Endoscopy;  Laterality: N/A;   COLONOSCOPY WITH PROPOFOL N/A 01/22/2020   Procedure: COLONOSCOPY WITH PROPOFOL;  Surgeon: Toledo, Benay Pike, MD;  Location: [facility] ENDOSCOPY;  Service: Gastroenterology;  Laterality: N/A;   ESOPHAGOGASTRODUODENOSCOPY (EGD) WITH PROPOFOL N/A 07/06/2016   Procedure: ESOPHAGOGASTRODUODENOSCOPY (EGD) WITH PROPOFOL;  Surgeon: Manya Silvas, MD;  Location[facility] ENDOSCOPY;  Service: Endoscopy;  Laterality: N/A;   RETINAL DETACHMENT SURGERY  2008   TONSILLECTOMY AND ADENOIDECTOMY  34's   VARICOSE VEIN SURGERY  2013   Family  History  Problem Relation Age of Onset   Hypertension Mother    Dementia Mother    Colon cancer Mother    Kidney disease Father    Hypertension Father    Diabetes Father    Dementia Father    Colon cancer Sister    Mitral valve prolapse Daughter    Anxiety disorder Daughter    Ulcerative colitis Son    Heart attack Maternal Grandfather    Colon cancer Paternal Grandmother    CVA Paternal Grandfather    Coronary artery disease Neg Hx    Social History   Tobacco Use   Smoking status: Former Smoker    Years: 1.00    Types: Cigarettes   Smokeless tobacco: Never Used   Tobacco comment: >50 years ago in college  Vaping Use   Vaping Use: Never used  Substance Use Topics   Alcohol use: Yes    Alcohol/week: 7.0 standard drinks    Types: 7 Glasses of wine per week    Comment: 2 oz   Drug use: No   Allergies  Allergen Reactions   Aspirin     REACTION: upsets stomach    Medications: Outpatient Medications Prior to Visit  Medication Sig   b complex vitamins capsule Take 1 capsule by mouth daily.   CHOLECALCIFEROL PO Take by mouth.   LORazepam (ATIVAN) 1 MG tablet Take 1 tablet (1 mg total) by mouth at bedtime. As needed.   omeprazole (PRILOSEC) 20 MG capsule Take 20 mg by mouth daily.    vitamin B-12 (CYANOCOBALAMIN) 250 MCG tablet Take 250 mcg by mouth daily.    vitamin E (VITAMIN E) 180 MG (400 UNITS) capsule Take 400 Units by mouth daily.   No facility-administered medications prior to visit.   Review of Systems  Constitutional: Negative.   Respiratory: Negative.   Cardiovascular: Negative.   Gastrointestinal: Negative.   Neurological: Positive for headaches (Chronic issue). Negative for dizziness and light-headedness.   Last CBC Lab Results  Component Value Date   WBC 7.2 02/10/2020   HGB 13.4 02/10/2020   HCT 39.7 02/10/2020   MCV 99 (H) 02/10/2020   MCH 33.3 (H) 02/10/2020   RDW 12.2 02/10/2020   PLT 203 02/10/2020   Lab  Results  Component Value Date   CHOL 237 (H) 01/30/2019   HDL 81 01/30/2019   LDLCALC 144 (H) 01/30/2019   TRIG 58 01/30/2019   CHOLHDL 2.9 01/30/2019     Objective    BP (!) 160/84 (BP Location: Right Arm, Patient Position: Sitting, Cuff Size: Large)    Pulse 60    Temp (!) 95.9 F (35.5 C) (Temporal)    Wt 165 lb (74.8 kg)    SpO2 95%    BMI 23.68 kg/m  BP Readings from Last 3 Encounters:  05/20/20 (!) 160/84  02/06/20 (!) 157/74  01/22/20 (!) 144/81    Physical Exam Constitutional:      General: He is not in acute distress.    Appearance: He is well-developed.  HENT:     Head: Normocephalic and atraumatic.     Right Ear: Hearing and tympanic membrane normal.  Left Ear: Hearing and tympanic membrane normal.     Nose: Nose normal.  Eyes:     General: Lids are normal. No scleral icterus.       Right eye: No discharge.        Left eye: No discharge.     Conjunctiva/sclera: Conjunctivae normal.  Cardiovascular:     Rate and Rhythm: Normal rate and regular rhythm.     Heart sounds: Normal heart sounds.  Pulmonary:     Effort: Pulmonary effort is normal. No respiratory distress.     Breath sounds: Normal breath sounds.  Abdominal:     General: Bowel sounds are normal.     Palpations: Abdomen is soft.  Musculoskeletal:        General: Normal range of motion.     Cervical back: Neck supple.     Comments: Many large varicose veins in both legs.  Skin:    Findings: No lesion or rash.  Neurological:     Mental Status: He is alert and oriented to person, place, and time.  Psychiatric:        Speech: Speech normal.        Behavior: Behavior normal.        Thought Content: Thought content normal.     No results found for any visits on 05/20/20.  Assessment & Plan     1. Essential (primary) hypertension History of "White Coat Syndrome" and BP elevated today. Has record of BP in the 100-110/70-80 range at home. Working on a plant based diet, restricting salt and  caffeine intake. Recheck labs and follow up prn. - CBC with Differential/Platelet - Comprehensive metabolic panel  2. Anxiety, generalized Unchanged anxiety episodes without panic attacks. Rarely uses Lorazepam at bedtime. No suicidal ideation.  3. Hypercholesterolemia Continue to work on low fat diet and swimming for exercise daily. Weight stable. Needs follow up labs. - Comprehensive metabolic panel - Lipid panel  4. Asymptomatic varicose veins of both lower extremities Unchanged multiple large varicose veins of both lower legs. No ulcerations or pitting edema. Should follow up vascular surgeon as planned.  5. Nonintractable headache, unspecified chronicity pattern, unspecified headache type Occasional "migraines" that are better controlled with a "plant-based diet". Given headache diet plan and recheck labs. - CBC with Differential/Platelet - Comprehensive metabolic panel  6. History of Barrett's esophagus Stable without melena, hematemesis or abdominal pain. Trying to taper back on Omeprazole. Switching to Pepcid. Followed by Dr. Vira Agar (GI) and will get labs. - CBC with Differential/Platelet   No follow-ups on file.         [name], [place] [phone] (phone) [phone] (fax)  [place]

## 2020-05-20 ENCOUNTER — Other Ambulatory Visit: Payer: Self-pay

## 2020-05-20 ENCOUNTER — Ambulatory Visit (INDEPENDENT_AMBULATORY_CARE_PROVIDER_SITE_OTHER): Payer: PPO | Admitting: Family Medicine

## 2020-05-20 ENCOUNTER — Encounter: Payer: Self-pay | Admitting: Family Medicine

## 2020-05-20 VITALS — BP 160/84 | HR 60 | Temp 95.9°F | Wt 165.0 lb

## 2020-05-20 DIAGNOSIS — F411 Generalized anxiety disorder: Secondary | ICD-10-CM | POA: Diagnosis not present

## 2020-05-20 DIAGNOSIS — E7800 Pure hypercholesterolemia, unspecified: Secondary | ICD-10-CM | POA: Diagnosis not present

## 2020-05-20 DIAGNOSIS — Z8719 Personal history of other diseases of the digestive system: Secondary | ICD-10-CM | POA: Diagnosis not present

## 2020-05-20 DIAGNOSIS — R519 Headache, unspecified: Secondary | ICD-10-CM

## 2020-05-20 DIAGNOSIS — I10 Essential (primary) hypertension: Secondary | ICD-10-CM | POA: Diagnosis not present

## 2020-05-20 DIAGNOSIS — I8393 Asymptomatic varicose veins of bilateral lower extremities: Secondary | ICD-10-CM | POA: Diagnosis not present

## 2020-05-21 ENCOUNTER — Telehealth: Payer: Self-pay

## 2020-05-21 LAB — CBC WITH DIFFERENTIAL/PLATELET
Basophils Absolute: 0.1 10*3/uL (ref 0.0–0.2)
Basos: 1 %
EOS (ABSOLUTE): 0.1 10*3/uL (ref 0.0–0.4)
Eos: 1 %
Hematocrit: 36.9 % — ABNORMAL LOW (ref 37.5–51.0)
Hemoglobin: 13 g/dL (ref 13.0–17.7)
Immature Grans (Abs): 0 10*3/uL (ref 0.0–0.1)
Immature Granulocytes: 0 %
Lymphocytes Absolute: 1.6 10*3/uL (ref 0.7–3.1)
Lymphs: 36 %
MCH: 33.7 pg — ABNORMAL HIGH (ref 26.6–33.0)
MCHC: 35.2 g/dL (ref 31.5–35.7)
MCV: 96 fL (ref 79–97)
Monocytes Absolute: 0.5 10*3/uL (ref 0.1–0.9)
Monocytes: 11 %
Neutrophils Absolute: 2.2 10*3/uL (ref 1.4–7.0)
Neutrophils: 51 %
Platelets: 198 10*3/uL (ref 150–450)
RBC: 3.86 x10E6/uL — ABNORMAL LOW (ref 4.14–5.80)
RDW: 11.7 % (ref 11.6–15.4)
WBC: 4.3 10*3/uL (ref 3.4–10.8)

## 2020-05-21 LAB — COMPREHENSIVE METABOLIC PANEL
ALT: 21 IU/L (ref 0–44)
AST: 29 IU/L (ref 0–40)
Albumin/Globulin Ratio: 2 (ref 1.2–2.2)
Albumin: 4.5 g/dL (ref 3.7–4.7)
Alkaline Phosphatase: 67 IU/L (ref 48–121)
BUN/Creatinine Ratio: 20 (ref 10–24)
BUN: 21 mg/dL (ref 8–27)
Bilirubin Total: 0.6 mg/dL (ref 0.0–1.2)
CO2: 24 mmol/L (ref 20–29)
Calcium: 9.2 mg/dL (ref 8.6–10.2)
Chloride: 103 mmol/L (ref 96–106)
Creatinine, Ser: 1.07 mg/dL (ref 0.76–1.27)
GFR calc Af Amer: 78 mL/min/{1.73_m2} (ref >59)
GFR calc non Af Amer: 68 mL/min/{1.73_m2} (ref >59)
Globulin, Total: 2.2 g/dL (ref 1.5–4.5)
Glucose: 89 mg/dL (ref 65–99)
Potassium: 4.5 mmol/L (ref 3.5–5.2)
Sodium: 140 mmol/L (ref 134–144)
Total Protein: 6.7 g/dL (ref 6.0–8.5)

## 2020-05-21 LAB — LIPID PANEL
Chol/HDL Ratio: 2.7 ratio (ref 0.0–5.0)
Cholesterol, Total: 191 mg/dL (ref 100–199)
HDL: 72 mg/dL (ref >39)
LDL Chol Calc (NIH): 112 mg/dL — ABNORMAL HIGH (ref 0–99)
Triglycerides: 36 mg/dL (ref 0–149)
VLDL Cholesterol Cal: 7 mg/dL (ref 5–40)

## 2020-05-21 NOTE — Telephone Encounter (Signed)
Patient advised of lab results and expressed verbal understanding. 

## 2020-05-21 NOTE — Telephone Encounter (Signed)
-----   Message from Margo Common, [place] sent at 05/21/2020  8:22 AM EDT ----- Cholesterol improved and risk ratio below 1/2 average risk. Remainder of blood tests essentially normal except slightly low hematocrit with normal hemoglobin. Recommend multivitamin daily to supply basic building blocks for preventing anemia. Recheck progress in 6 months.

## 2020-06-21 ENCOUNTER — Telehealth: Payer: Self-pay | Admitting: Family Medicine

## 2020-06-21 ENCOUNTER — Ambulatory Visit (INDEPENDENT_AMBULATORY_CARE_PROVIDER_SITE_OTHER): Payer: PPO | Admitting: Physician Assistant

## 2020-06-21 ENCOUNTER — Other Ambulatory Visit: Payer: Self-pay

## 2020-06-21 VITALS — BP 132/78 | HR 97 | Temp 97.9°F | Wt 154.6 lb

## 2020-06-21 DIAGNOSIS — H5347 Heteronymous bilateral field defects: Secondary | ICD-10-CM | POA: Diagnosis not present

## 2020-06-21 DIAGNOSIS — Z0100 Encounter for examination of eyes and vision without abnormal findings: Secondary | ICD-10-CM

## 2020-06-21 NOTE — Telephone Encounter (Signed)
L/M to advise pt to come in today at 1:40 instead of 4:20pm.

## 2020-06-21 NOTE — Telephone Encounter (Signed)
Patient called to schedule an appt. Asap, for his left eye which he stated might be out of sink.  He said he has a history of a torn retina and would like the doctor to look at it fairly soon.  Informed patient that his doctor does not have anything for a few weeks.  Patient would like to be worked in today if possible.  Please call patient to discuss who he could be worked in with asap.  CB# [phone]

## 2020-06-21 NOTE — Progress Notes (Signed)
Established patient visit   Patient: Mike Spence   DOB: 1946-08-02   74 y.o. Male  MRN: [id number] Visit Date: 06/21/2020  Today's healthcare provider: Trinna Post, PA-C   Chief Complaint  Patient presents with  . Eye Problem  I,Porsha C McClurkin,acting as a scribe for Trinna Post, PA-C.,have documented all relevant documentation on the behalf of Trinna Post, PA-C,as directed by  Trinna Post, PA-C while in the presence of Trinna Post, PA-C.  Subjective    Eye Problem  The left eye is affected. This is a new problem. The current episode started 1 to 4 weeks ago. The problem occurs constantly. The problem has been unchanged. There was no injury mechanism. The pain is at a severity of 0/10. The patient is experiencing no pain. There is no known exposure to pink eye. He does not wear contacts. Pertinent negatives include no blurred vision, double vision, eye redness or photophobia. He has tried nothing for the symptoms. The treatment provided no relief.  Patient is requesting a referral to [facility] with Dr. Wallace Going.       Medications: Outpatient Medications Prior to Visit  Medication Sig  . b complex vitamins capsule Take 1 capsule by mouth daily.  Marland Kitchen omeprazole (PRILOSEC) 20 MG capsule Take 20 mg by mouth daily.   . CHOLECALCIFEROL PO Take by mouth. (Patient not taking: Reported on 06/21/2020)  . LORazepam (ATIVAN) 1 MG tablet Take 1 tablet (1 mg total) by mouth at bedtime. As needed. (Patient not taking: Reported on 06/21/2020)   No facility-administered medications prior to visit.    Review of Systems  Constitutional: Negative.   Eyes: Positive for pain. Negative for blurred vision, double vision, photophobia and redness.  Respiratory: Negative.   Cardiovascular: Negative.   Hematological: Negative.       Objective    BP 132/78 (BP Location: Left Arm, Patient Position: Sitting, Cuff Size: Normal)   Pulse 97   Temp 97.9 F  (36.6 C) (Oral)   Wt 154 lb 9.6 oz (70.1 kg)   SpO2 96%   BMI 22.18 kg/m    Physical Exam Constitutional:      Appearance: Normal appearance. He is normal weight.  Eyes:     General:        Right eye: No discharge.        Left eye: No discharge.     Extraocular Movements: Extraocular movements intact.     Conjunctiva/sclera: Conjunctivae normal.     Pupils: Pupils are equal, round, and reactive to light.  Cardiovascular:     Rate and Rhythm: Normal rate and regular rhythm.     Pulses: Normal pulses.     Heart sounds: Normal heart sounds.  Pulmonary:     Effort: Pulmonary effort is normal.     Breath sounds: Normal breath sounds.  Skin:    General: Skin is warm and dry.  Neurological:     General: No focal deficit present.     Mental Status: He is alert and oriented to person, place, and time.  Psychiatric:        Mood and Affect: Mood normal.        Behavior: Behavior normal.       No results found for any visits on 06/21/20.  Assessment & Plan    1. Encounter for eye exam Patient requested a referral to the eye doctor  and referral was placed as below. - Ambulatory referral to  Ophthalmology   Return if symptoms worsen or fail to improve.      ITrinna Post, PA-C, have reviewed all documentation for this visit. The documentation on 06/22/20 for the exam, diagnosis, procedures, and orders are all accurate and complete.    Paulene Floor  [facility] [phone] (phone) [phone] (fax)  [place]

## 2020-06-21 NOTE — Telephone Encounter (Signed)
Mrs. Claudina Lick is out of office this afternoon and had no available slots to be seen this morning. I reviewed over providers schedule Adriana had a 4:20 same day appt still open. I spoke with patent who states that he has blurred vision in eye and wanted to have it checked in office, he does not recall recent injury or foreign object in eye that would affect his vision. KW

## 2020-06-22 ENCOUNTER — Other Ambulatory Visit: Payer: Self-pay | Admitting: Ophthalmology

## 2020-06-22 DIAGNOSIS — H5347 Heteronymous bilateral field defects: Secondary | ICD-10-CM

## 2020-06-28 ENCOUNTER — Ambulatory Visit: Payer: PPO

## 2020-07-22 ENCOUNTER — Other Ambulatory Visit: Payer: PPO

## 2020-07-22 ENCOUNTER — Other Ambulatory Visit: Payer: Self-pay | Admitting: Family Medicine

## 2020-07-22 ENCOUNTER — Other Ambulatory Visit: Payer: Self-pay

## 2020-07-22 DIAGNOSIS — R972 Elevated prostate specific antigen [PSA]: Secondary | ICD-10-CM | POA: Diagnosis not present

## 2020-07-23 ENCOUNTER — Encounter: Payer: Self-pay | Admitting: Urology

## 2020-07-23 ENCOUNTER — Ambulatory Visit: Payer: PPO | Admitting: Urology

## 2020-07-23 VITALS — BP 149/76 | HR 111 | Ht 71.0 in | Wt 150.0 lb

## 2020-07-23 DIAGNOSIS — N401 Enlarged prostate with lower urinary tract symptoms: Secondary | ICD-10-CM | POA: Diagnosis not present

## 2020-07-23 DIAGNOSIS — R351 Nocturia: Secondary | ICD-10-CM

## 2020-07-23 DIAGNOSIS — R972 Elevated prostate specific antigen [PSA]: Secondary | ICD-10-CM

## 2020-07-23 LAB — PSA: Prostate Specific Ag, Serum: 10.3 ng/mL — ABNORMAL HIGH (ref 0.0–4.0)

## 2020-07-23 MED ORDER — TAMSULOSIN HCL 0.4 MG PO CAPS: 0.4000 mg | ORAL_CAPSULE | Freq: Every day | ORAL | 0 refills | Status: DC

## 2020-07-23 NOTE — Progress Notes (Signed)
07/23/2020 1:24 PM   Comer Locket [birth year] [id number]  Referring provider: Margo Common, [place],  [place] [address]  Chief Complaint  Patient presents with   Elevated PSA    follow up    Urologic history: 1.  Elevated PSA -Prostate biopsy 06/2018; PSA 5.1; 4K 56% probability high grade cancer -Volume 44 g; left mid core with focus high-grade PIN -PSA bump 6.11 December 2018; prostate MRI March 2020 35 g gland no suspicious lesions  HPI: 74 y.o. male presents for follow-up.   No complaints since last visit  No bothersome LUTS   Nocturia x3  Denies flank, abdominal, pelvic pain  No dysuria or gross hematuria  PSA 07/22/2020 10.3  PSA trend:      PMH: Past Medical History:  Diagnosis Date   Anxiety    Barrett's esophagus    Colon adenomas    Diaphragmatic hernia    Dysrhythmia    GERD (gastroesophageal reflux disease)    Herniated nucleus pulposus of lumbosacral region    Hypertension    Migraines    Palpitations    Hx of them- identified as PVC's. did a holter monitor in 2/10 showing frequent PVC's. Additionally, the patient had an echcardiogram done in 2/10. EF 55%. there was some abnormal septal motion that may have been due to frequent PVC's. there was no significant valvular disorder and the RV was normal in size and function.   Peripheral neuropathy    Varicose vein of leg     Surgical History: Past Surgical History:  Procedure Laterality Date   COLONOSCOPY WITH PROPOFOL N/A 07/06/2016   Procedure: COLONOSCOPY WITH PROPOFOL;  Surgeon: Manya Silvas, MD;  Location: [facility] ENDOSCOPY;  Service: Endoscopy;  Laterality: N/A;   COLONOSCOPY WITH PROPOFOL N/A 01/22/2020   Procedure: COLONOSCOPY WITH PROPOFOL;  Surgeon: Toledo, Benay Pike, MD;  Location: [facility] ENDOSCOPY;  Service: Gastroenterology;  Laterality: N/A;   ESOPHAGOGASTRODUODENOSCOPY (EGD) WITH PROPOFOL N/A 07/06/2016   Procedure:  ESOPHAGOGASTRODUODENOSCOPY (EGD) WITH PROPOFOL;  Surgeon: Manya Silvas, MD;  Location: [facility] ENDOSCOPY;  Service: Endoscopy;  Laterality: N/A;   RETINAL DETACHMENT SURGERY  2008   TONSILLECTOMY AND ADENOIDECTOMY  1950's   VARICOSE VEIN SURGERY  2013    Home Medications:  Allergies as of 07/23/2020      Reactions   Aspirin    REACTION: upsets stomach      Medication List       Accurate as of July 23, 2020  1:24 PM. If you have any questions, ask your nurse or doctor.        b complex vitamins capsule Take 1 capsule by mouth daily.   CHOLECALCIFEROL PO Take by mouth.   LORazepam 1 MG tablet Commonly known as: ATIVAN Take 1 tablet (1 mg total) by mouth at bedtime. As needed.   omeprazole 20 MG capsule Commonly known as: PRILOSEC Take 20 mg by mouth daily.       Allergies:  Allergies  Allergen Reactions   Aspirin     REACTION: upsets stomach    Family History: Family History  Problem Relation Age of Onset   Hypertension Mother    Dementia Mother    Colon cancer Mother    Kidney disease Father    Hypertension Father    Diabetes Father    Dementia Father    Colon cancer Sister    Mitral valve prolapse Daughter    Anxiety disorder Daughter    Ulcerative colitis Son  Heart attack Maternal Grandfather    Colon cancer Paternal Grandmother    CVA Paternal Grandfather    Coronary artery disease Neg Hx     Social History:  reports that he has quit smoking. His smoking use included cigarettes. He quit after 1.00 year of use. He has never used smokeless tobacco. He reports current alcohol use of about 7.0 standard drinks of alcohol per week. He reports that he does not use drugs.   Physical Exam: BP (!) 149/76    Pulse (!) 111    Ht 5\' 11"  (1.803 m)    Wt 150 lb (68 kg)    BMI 20.92 kg/m   Constitutional:  Alert and oriented, No acute distress. HEENT: [place] AT, moist mucus membranes.  Trachea midline, no masses. Cardiovascular: No  clubbing, cyanosis, or edema. Respiratory: Normal respiratory effort, no increased work of breathing. GU: Prostate 45 g, smooth without nodules Lymph: No cervical or inguinal lymphadenopathy. Skin: No rashes, bruises or suspicious lesions. Neurologic: Grossly intact, no focal deficits, moving all 4 extremities. Psychiatric: Normal mood and affect.   Assessment & Plan:    1.  Elevated PSA  Significant PSA bump to 10.3  Recent MRI showed no suspicious lesions and would not repeat  Recheck PSA 1 month  Tamsulosin 0.4 mg x 30 days prior to recheck  If PSA remains significantly elevated recommend repeat standard prostate biopsy  2.  BPH with nocturia  Stable   Abbie Sons, MD  [facility] [address], [place], [place] [address] [phone]

## 2020-07-27 LAB — URINALYSIS, COMPLETE
Bilirubin, UA: NEGATIVE
Glucose, UA: NEGATIVE
Leukocytes,UA: NEGATIVE
Nitrite, UA: NEGATIVE
Protein,UA: NEGATIVE
Specific Gravity, UA: 1.02 (ref 1.005–1.030)
Urobilinogen, Ur: 0.2 mg/dL (ref 0.2–1.0)
pH, UA: 6 (ref 5.0–7.5)

## 2020-07-27 LAB — MICROSCOPIC EXAMINATION: Bacteria, UA: NONE SEEN

## 2020-08-27 ENCOUNTER — Other Ambulatory Visit: Payer: Self-pay

## 2020-08-27 ENCOUNTER — Other Ambulatory Visit: Payer: PPO

## 2020-08-27 DIAGNOSIS — R972 Elevated prostate specific antigen [PSA]: Secondary | ICD-10-CM

## 2020-08-28 LAB — PSA: Prostate Specific Ag, Serum: 11.8 ng/mL — ABNORMAL HIGH (ref 0.0–4.0)

## 2020-08-31 ENCOUNTER — Telehealth: Payer: Self-pay | Admitting: *Deleted

## 2020-08-31 NOTE — Telephone Encounter (Signed)
-----   Message from Nori Riis, PA-C sent at 08/30/2020  8:12 AM EDT ----- Please let Mr. Spivack know that his PSA returned at 11.8 and per Dr. Dene Gentry recommendations, if PSA remains elevated to schedule a prostate biopsy with him.   I did not see that he was on any blood thinners, but we need to confirm.

## 2020-08-31 NOTE — Telephone Encounter (Signed)
Notified patient as instructed, patient pleased. Discussed follow-up appointments, patient agrees  

## 2020-09-13 DIAGNOSIS — H40023 Open angle with borderline findings, high risk, bilateral: Secondary | ICD-10-CM | POA: Diagnosis not present

## 2020-09-15 ENCOUNTER — Ambulatory Visit: Payer: PPO | Admitting: Urology

## 2020-09-15 ENCOUNTER — Other Ambulatory Visit: Payer: Self-pay

## 2020-09-15 ENCOUNTER — Encounter: Payer: Self-pay | Admitting: Urology

## 2020-09-15 VITALS — BP 166/87 | HR 102 | Ht 71.0 in | Wt 150.0 lb

## 2020-09-15 DIAGNOSIS — R972 Elevated prostate specific antigen [PSA]: Secondary | ICD-10-CM

## 2020-09-15 MED ORDER — GENTAMICIN SULFATE 40 MG/ML IJ SOLN
80.0000 mg | Freq: Once | INTRAMUSCULAR | Status: AC
  Administered 2020-09-15: 80 mg via INTRAMUSCULAR

## 2020-09-15 MED ORDER — LEVOFLOXACIN 500 MG PO TABS
500.0000 mg | ORAL_TABLET | Freq: Once | ORAL | Status: AC
  Administered 2020-09-15: 500 mg via ORAL

## 2020-09-15 NOTE — Progress Notes (Signed)
Prostate Biopsy Procedure   Urologic history: 1.Elevated PSA -Prostate biopsy 06/2018; PSA 5.1; 4K 56% probability high grade cancer -Volume 44 g; left mid core with focus high-grade PIN -PSA bump 6.11 December 2018; prostate MRI March 2020 35 g gland no suspicious lesions Informed consent was obtained after discussing risks/benefits of the procedure.  A time out was performed to ensure correct patient identity.  Pre-Procedure: - Last PSA Level: 11.8 on 08/27/2020 - Gentamicin given prophylactically - Levaquin 500 mg administered PO -Transrectal Ultrasound performed revealing a 36 gm prostate -No significant hypoechoic or median lobe noted  Procedure: - Prostate block performed using 10 cc 1% lidocaine and biopsies taken from sextant areas, a total of 12 under ultrasound guidance.  Post-Procedure: - Patient tolerated the procedure well - He was counseled to seek immediate medical attention if experiences any severe pain, significant bleeding, or fevers - Return in one week to discuss biopsy results   John Giovanni, MD

## 2020-09-21 LAB — SURGICAL PATHOLOGY

## 2020-09-22 ENCOUNTER — Telehealth: Payer: Self-pay | Admitting: *Deleted

## 2020-09-22 MED ORDER — TAMSULOSIN HCL 0.4 MG PO CAPS: 0.4000 mg | ORAL_CAPSULE | Freq: Every day | ORAL | 3 refills | Status: DC

## 2020-09-22 NOTE — Telephone Encounter (Signed)
-----   Message from Abbie Sons, MD sent at 09/22/2020  7:33 AM EDT ----- Please let patient know prostate biopsy showed no evidence of cancer.  Recommend lab visit for PSA 6 months and 1 year follow-up office visit with PSA  Can cancel prostate biopsy follow-up appointment

## 2020-09-22 NOTE — Telephone Encounter (Signed)
Notified patient as instructed, patient pleased. patient agrees  

## 2020-09-23 ENCOUNTER — Other Ambulatory Visit: Payer: Self-pay

## 2020-09-23 ENCOUNTER — Ambulatory Visit (INDEPENDENT_AMBULATORY_CARE_PROVIDER_SITE_OTHER): Payer: PPO

## 2020-09-23 DIAGNOSIS — Z23 Encounter for immunization: Secondary | ICD-10-CM | POA: Diagnosis not present

## 2020-09-29 ENCOUNTER — Ambulatory Visit: Payer: Self-pay | Admitting: Urology

## 2021-02-02 NOTE — Progress Notes (Addendum)
Subjective:   Mike Spence is a 75 y.o. male who presents for Medicare Annual/Subsequent preventive examination.  I connected with Abhinav Mayorquin today by telephone and verified that I am speaking with the correct person using two identifiers. Location patient: home Location provider: work Persons participating in the virtual visit: patient, provider.   I discussed the limitations, risks, security and privacy concerns of performing an evaluation and management service by telephone and the availability of in person appointments. I also discussed with the patient that there may be a patient responsible charge related to this service. The patient expressed understanding and verbally consented to this telephonic visit.    Interactive audio and video telecommunications were attempted between this provider and patient, however failed, due to patient having technical difficulties OR patient did not have access to video capability.  We continued and completed visit with audio only.   Review of Systems    N/A  Cardiac Risk Factors include: advanced age (>106men, >44 women);male gender     Objective:    There were no vitals filed for this visit. There is no height or weight on file to calculate BMI.  Advanced Directives 02/03/2021 02/02/2020 01/22/2020 01/14/2019 01/10/2018 01/03/2017 07/06/2016  Does Patient Have a Medical Advance Directive? Yes Yes Yes Yes Yes Yes No  Type of Paramedic of [place];Living will [place];Living will Healthcare Power of [place];Living will [place];Living will [place];Living will -  Copy of [place] in Chart? No - copy requested No - copy requested - No - copy requested No - copy requested No - copy requested -  Would patient like information on creating a medical advance directive? - - - - - - No - patient declined information     Current Medications (verified) Outpatient Encounter Medications as of 02/03/2021  Medication Sig   calcium citrate (CALCITRATE - DOSED IN MG ELEMENTAL CALCIUM) 950 (200 Ca) MG tablet Take 200 mg of elemental calcium by mouth daily.   LORazepam (ATIVAN) 1 MG tablet Take 1 tablet (1 mg total) by mouth at bedtime. As needed.   MAGNESIUM CITRATE PO Take by mouth daily at 6 (six) AM.   Multiple Vitamin (MULTIVITAMIN) capsule Take 1 capsule by mouth daily.   omeprazole (PRILOSEC) 20 MG capsule Take 10 mg by mouth daily.   b complex vitamins capsule Take 1 capsule by mouth daily. (Patient not taking: Reported on 02/03/2021)   CHOLECALCIFEROL PO Take by mouth.  (Patient not taking: Reported on 02/03/2021)   tamsulosin (FLOMAX) 0.4 MG CAPS capsule Take 1 capsule (0.4 mg total) by mouth daily. (Patient not taking: Reported on 02/03/2021)   No facility-administered encounter medications on file as of 02/03/2021.    Allergies (verified) Aspirin   History: Past Medical History:  Diagnosis Date   Anxiety    Barrett's esophagus    Colon adenomas    Diaphragmatic hernia    Dysrhythmia    GERD (gastroesophageal reflux disease)    Herniated nucleus pulposus of lumbosacral region    Hypertension    Migraines    Palpitations    Hx of them- identified as PVC's. did a holter monitor in 2/10 showing frequent PVC's. Additionally, the patient had an echcardiogram done in 2/10. EF 55%. there was some abnormal septal motion that may have been due to frequent PVC's. there was no significant valvular disorder and the RV was normal in size and function.  Peripheral neuropathy    Varicose vein of leg    Past Surgical History:  Procedure Laterality Date   COLONOSCOPY WITH PROPOFOL N/A 07/06/2016   Procedure: COLONOSCOPY WITH PROPOFOL;  Surgeon: Manya Silvas, MD;  Location[facility]COPY;  Service: Endoscopy;  Laterality: N/A;   COLONOSCOPY WITH PROPOFOL N/A 01/22/2020   Procedure: COLONOSCOPY WITH PROPOFOL;   Surgeon: Toledo, Benay Pike, MD;  Location: [facility] ENDOSCOPY;  Service: Gastroenterology;  Laterality: N/A;   ESOPHAGOGASTRODUODENOSCOPY (EGD) WITH PROPOFOL N/A 07/06/2016   Procedure: ESOPHAGOGASTRODUODENOSCOPY (EGD) WITH PROPOFOL;  Surgeon: Manya Silvas, MD;  Location[facility] ENDOSCOPY;  Service: Endoscopy;  Laterality: N/A;   PROSTATE BIOPSY     RETINAL DETACHMENT SURGERY  2008   TONSILLECTOMY AND ADENOIDECTOMY  70's   VARICOSE VEIN SURGERY  2013   Family History  Problem Relation Age of Onset   Hypertension Mother    Dementia Mother    Colon cancer Mother    Kidney disease Father    Hypertension Father    Diabetes Father    Dementia Father    Colon cancer Sister    Mitral valve prolapse Daughter    Anxiety disorder Daughter    Ulcerative colitis Son    Heart attack Maternal Grandfather    Colon cancer Paternal Grandmother    CVA Paternal Grandfather    Coronary artery disease Neg Hx    Social History   Socioeconomic History   Marital status: Married    Spouse name: Not on file   Number of children: 2   Years of education: Not on file   Highest education level: Bachelor's degree (e.g., BA, AB, BS)  Occupational History   Occupation: retired  Tobacco Use   Smoking status: Former Smoker    Years: 1.00    Types: Cigarettes   Smokeless tobacco: Never Used   Tobacco comment: >50 years ago in Charity fundraiser Use: Never used  Substance and Sexual Activity   Alcohol use: Yes    Alcohol/week: 0.0 standard drinks    Comment: glass of wine every 6 months   Drug use: No   Sexual activity: Not Currently  Other Topics Concern   Not on file  Social History Narrative   Married and lives in [place] with wife. Works for Doctor, general practice.    Social Determinants of Health   Financial Resource Strain: Low Risk    Difficulty of Paying Living Expenses: Not hard at all  Food Insecurity: No Food Insecurity   Worried About Charity fundraiser in the Last Year: Never  true   Arboriculturist in the Last Year: Never true  Transportation Needs: No Transportation Needs   Lack of Transportation (Medical): No   Lack of Transportation (Non-Medical): No  Physical Activity: Sufficiently Active   Days of Exercise per Week: 3 days   Minutes of Exercise per Session: 50 min  Stress: No Stress Concern Present   Feeling of Stress : Not at all  Social Connections: Socially Integrated   Frequency of Communication with Friends and Family: Three times a week   Frequency of Social Gatherings with Friends and Family: More than three times a week   Attends Religious Services: More than 4 times per year   Active Member of [employer] or Organizations: Yes   Attends Music therapist: More than 4 times per year   Marital Status: Married    Tobacco Counseling Counseling given: Not Answered Comment: >50 years ago in college  Clinical Intake:  Pre-visit preparation completed: Yes  Pain : No/denies pain     Nutritional Risks: None Diabetes: No  How often do you need to have someone help you when you read instructions, pamphlets, or other written materials from your doctor or pharmacy?: 1 - Never  Diabetic? No  Interpreter Needed?: No  Information entered by :: [facility], LPN   Activities of Daily Living In your present state of health, do you have any difficulty performing the following activities: 02/03/2021  Hearing? N  Vision? N  Difficulty concentrating or making decisions? N  Walking or climbing stairs? N  Dressing or bathing? N  Doing errands, shopping? N  Preparing Food and eating ? N  Using the Toilet? N  In the past six months, have you accidently leaked urine? N  Do you have problems with loss of bowel control? N  Managing your Medications? N  Managing your Finances? N  Housekeeping or managing your Housekeeping? N  Some recent data might be hidden    Patient Care Team: Chrismon, Vickki Muff, PA-C as PCP - General (Physician  Assistant) Birder Robson, MD as Referring Physician (Ophthalmology) Abbie Sons, MD (Urology) Efrain Sella, MD as Consulting Physician (Gastroenterology)  Indicate any recent Medical Services you may have received from other than [facility] providers in the past year (date may be approximate).     Assessment:   This is a routine wellness examination for [place].  Hearing/Vision screen No exam data present  Dietary issues and exercise activities discussed: Current Exercise Habits: Structured exercise class, Type of exercise: Other - see comments (swimming), Time (Minutes): 50, Frequency (Times/Week): 3, Weekly Exercise (Minutes/Week): 150, Intensity: Mild, Exercise limited by: None identified  Goals      DIET - INCREASE WATER INTAKE     Recommend increasing water intake to 6-8 8 oz glasses a day.        Depression Screen PHQ 2/9 Scores 02/03/2021 02/02/2020 01/14/2019 01/14/2019 01/10/2018 01/10/2018 01/03/2017  PHQ - 2 Score 0 0 0 0 0 0 0  PHQ- 9 Score - - 0 - 0 - -    Fall Risk Fall Risk  02/03/2021 02/02/2020 01/14/2019 01/10/2018 01/03/2017  Falls in the past year? 0 0 0 No No  Number falls in past yr: 0 0 - - -  Injury with Fall? 0 0 - - -    FALL RISK PREVENTION PERTAINING TO THE HOME:  Any stairs in or around the home? Yes  If so, are there any without handrails? No  Home free of loose throw rugs in walkways, pet beds, electrical cords, etc? Yes  Adequate lighting in your home to reduce risk of falls? Yes   ASSISTIVE DEVICES UTILIZED TO PREVENT FALLS:  Life alert? No  Use of a cane, walker or w/c? No  Grab bars in the bathroom? Yes  Shower chair or bench in shower? No  Elevated toilet seat or a handicapped toilet? No   Cognitive Function: Normal cognitive status assessed by observation by this Nurse Health Advisor. No abnormalities found.       6CIT Screen 01/03/2017  What Year? 0 points  What month? 0 points  What time? 0 points  Count back from 20 0 points   Months in reverse 0 points  Repeat phrase 2 points  Total Score 2    Immunizations Immunization History  Administered Date(s) Administered   Fluad Quad(high Dose 65+) 09/10/2019, 09/23/2020   Influenza, High Dose Seasonal PF 09/20/2015, 01/03/2017, 01/17/2018, 01/14/2019  PFIZER(Purple Top)SARS-COV-2 Vaccination 12/15/2019, 01/04/2020, 09/09/2020   Pneumococcal Conjugate-13 05/07/2014   Pneumococcal Polysaccharide-23 09/20/2015   Tdap 07/04/2011    TDAP status: Up to date  Flu Vaccine status: Up to date  Pneumococcal vaccine status: Up to date  Covid-19 vaccine status: Completed vaccines  Qualifies for Shingles Vaccine? Yes   Zostavax completed No   Shingrix Completed?: No.    Education has been provided regarding the importance of this vaccine. Patient has been advised to call insurance company to determine out of pocket expense if they have not yet received this vaccine. Advised may also receive vaccine at local pharmacy or Health Dept. Verbalized acceptance and understanding.  Screening Tests Health Maintenance  Topic Date Due   TETANUS/TDAP  07/03/2021   COLONOSCOPY (Pts 45-47yrs Insurance coverage will need to be confirmed)  01/21/2023   INFLUENZA VACCINE  Completed   COVID-19 Vaccine  Completed   Hepatitis C Screening  Completed   PNA vac Low Risk Adult  Completed   HPV VACCINES  Aged Out    Health Maintenance  There are no preventive care reminders to display for this patient.  Colorectal cancer screening: Type of screening: Colonoscopy. Completed 01/22/20. Repeat every 3 years  Lung Cancer Screening: (Low Dose CT Chest recommended if Age 103-80 years, 30 pack-year currently smoking OR have quit w/in 15years.) does not qualify.   Additional Screening:  Hepatitis C Screening: Up to date  Vision Screening: Recommended annual ophthalmology exams for early detection of glaucoma and other disorders of the eye. Is the patient up to date with their annual eye  exam?  Yes  Who is the provider or what is the name of the office in which the patient attends annual eye exams? Dr George Ina @ [place] If pt is not established with a provider, would they like to be referred to a provider to establish care? No .   Dental Screening: Recommended annual dental exams for proper oral hygiene  Community Resource Referral / Chronic Care Management: CRR required this visit?  No   CCM required this visit?  No      Plan:     I have personally reviewed and noted the following in the patient's chart:   Medical and social history Use of alcohol, tobacco or illicit drugs  Current medications and supplements Functional ability and status Nutritional status Physical activity Advanced directives List of other physicians Hospitalizations, surgeries, and ER visits in previous 12 months Vitals Screenings to include cognitive, depression, and falls Referrals and appointments  In addition, I have reviewed and discussed with patient certain preventive protocols, quality metrics, and best practice recommendations. A written personalized care plan for preventive services as well as general preventive health recommendations were provided to patient.      [place], [place]   04/08/2129   Nurse Notes: None.  Reviewed Nurse Health Advisor's note and plan. Agree with documentation and plan from screening visit.

## 2021-02-03 ENCOUNTER — Other Ambulatory Visit: Payer: Self-pay

## 2021-02-03 ENCOUNTER — Ambulatory Visit (INDEPENDENT_AMBULATORY_CARE_PROVIDER_SITE_OTHER): Payer: PPO

## 2021-02-03 DIAGNOSIS — Z0000 Encounter for general adult medical examination without abnormal findings: Secondary | ICD-10-CM | POA: Diagnosis not present

## 2021-02-03 NOTE — Patient Instructions (Addendum)
Mike Spence , Thank you for taking time to come for your Medicare Wellness Visit. I appreciate your ongoing commitment to your health goals. Please review the following plan we discussed and let me know if I can assist you in the future.   Screening recommendations/referrals: Colonoscopy: Up to date, due 01/2023 Recommended yearly ophthalmology/optometry visit for glaucoma screening and checkup Recommended yearly dental visit for hygiene and checkup  Vaccinations: Influenza vaccine: Done 09/23/20 Pneumococcal vaccine: Completed series Tdap vaccine: Up to date, due 06/2021 Shingles vaccine: Shingrix discussed. Please contact your pharmacy for coverage information.     Advanced directives: Please bring a copy of your POA (Power of Attorney) and/or Living Will to your next appointment.   Conditions/risks identified: Recommend increasing water intake to 6-8 8 oz glasses a day.   Next appointment: 04/01/21 @ 9:00 AM with Mike Spence. Declined scheduling an AWV for 2023 at this time.   Preventive Care 77 Years and Older, Male Preventive care refers to lifestyle choices and visits with your health care provider that can promote health and wellness. What does preventive care include?  A yearly physical exam. This is also called an annual well check.  Dental exams once or twice a year.  Routine eye exams. Ask your health care provider how often you should have your eyes checked.  Personal lifestyle choices, including:  Daily care of your teeth and gums.  Regular physical activity.  Eating a healthy diet.  Avoiding tobacco and drug use.  Limiting alcohol use.  Practicing safe sex.  Taking low doses of aspirin every day.  Taking vitamin and mineral supplements as recommended by your health care provider. What happens during an annual well check? The services and screenings done by your health care provider during your annual well check will depend on your age, overall health,  lifestyle risk factors, and family history of disease. Counseling  Your health care provider may ask you questions about your:  Alcohol use.  Tobacco use.  Drug use.  Emotional well-being.  Home and relationship well-being.  Sexual activity.  Eating habits.  History of falls.  Memory and ability to understand (cognition).  Work and work Statistician. Screening  You may have the following tests or measurements:  Height, weight, and BMI.  Blood pressure.  Lipid and cholesterol levels. These may be checked every 5 years, or more frequently if you are over 40 years old.  Skin check.  Lung cancer screening. You may have this screening every year starting at age 72 if you have a 30-pack-year history of smoking and currently smoke or have quit within the past 15 years.  Fecal occult blood test (FOBT) of the stool. You may have this test every year starting at age 19.  Flexible sigmoidoscopy or colonoscopy. You may have a sigmoidoscopy every 5 years or a colonoscopy every 10 years starting at age 44.  Prostate cancer screening. Recommendations will vary depending on your family history and other risks.  Hepatitis C blood test.  Hepatitis B blood test.  Sexually transmitted disease (STD) testing.  Diabetes screening. This is done by checking your blood sugar (glucose) after you have not eaten for a while (fasting). You may have this done every 1-3 years.  Abdominal aortic aneurysm (AAA) screening. You may need this if you are a current or former smoker.  Osteoporosis. You may be screened starting at age 58 if you are at high risk. Talk with your health care provider about your test results, treatment options, and  if necessary, the need for more tests. Vaccines  Your health care provider may recommend certain vaccines, such as:  Influenza vaccine. This is recommended every year.  Tetanus, diphtheria, and acellular pertussis (Tdap, Td) vaccine. You may need a Td booster  every 10 years.  Zoster vaccine. You may need this after age 68.  Pneumococcal 13-valent conjugate (PCV13) vaccine. One dose is recommended after age 71.  Pneumococcal polysaccharide (PPSV23) vaccine. One dose is recommended after age 26. Talk to your health care provider about which screenings and vaccines you need and how often you need them. This information is not intended to replace advice given to you by your health care provider. Make sure you discuss any questions you have with your health care provider. Document Released: 12/17/2015 Document Revised: 08/09/2016 Document Reviewed: 09/21/2015 Elsevier Interactive Patient Education  2017 [place] Prevention in the Home Falls can cause injuries. They can happen to people of all ages. There are many things you can do to make your home safe and to help prevent falls. What can I do on the outside of my home?  Regularly fix the edges of walkways and driveways and fix any cracks.  Remove anything that might make you trip as you walk through a door, such as a raised step or threshold.  Trim any bushes or trees on the path to your home.  Use bright outdoor lighting.  Clear any walking paths of anything that might make someone trip, such as rocks or tools.  Regularly check to see if handrails are loose or broken. Make sure that both sides of any steps have handrails.  Any raised decks and porches should have guardrails on the edges.  Have any leaves, snow, or ice cleared regularly.  Use sand or salt on walking paths during winter.  Clean up any spills in your garage right away. This includes oil or grease spills. What can I do in the bathroom?  Use night lights.  Install grab bars by the toilet and in the tub and shower. Do not use towel bars as grab bars.  Use non-skid mats or decals in the tub or shower.  If you need to sit down in the shower, use a plastic, non-slip stool.  Keep the floor dry. Clean up any  water that spills on the floor as soon as it happens.  Remove soap buildup in the tub or shower regularly.  Attach bath mats securely with double-sided non-slip rug tape.  Do not have throw rugs and other things on the floor that can make you trip. What can I do in the bedroom?  Use night lights.  Make sure that you have a light by your bed that is easy to reach.  Do not use any sheets or blankets that are too big for your bed. They should not hang down onto the floor.  Have a firm chair that has side arms. You can use this for support while you get dressed.  Do not have throw rugs and other things on the floor that can make you trip. What can I do in the kitchen?  Clean up any spills right away.  Avoid walking on wet floors.  Keep items that you use a lot in easy-to-reach places.  If you need to reach something above you, use a strong step stool that has a grab bar.  Keep electrical cords out of the way.  Do not use floor polish or wax that makes floors slippery. If you  must use wax, use non-skid floor wax.  Do not have throw rugs and other things on the floor that can make you trip. What can I do with my stairs?  Do not leave any items on the stairs.  Make sure that there are handrails on both sides of the stairs and use them. Fix handrails that are broken or loose. Make sure that handrails are as long as the stairways.  Check any carpeting to make sure that it is firmly attached to the stairs. Fix any carpet that is loose or worn.  Avoid having throw rugs at the top or bottom of the stairs. If you do have throw rugs, attach them to the floor with carpet tape.  Make sure that you have a light switch at the top of the stairs and the bottom of the stairs. If you do not have them, ask someone to add them for you. What else can I do to help prevent falls?  Wear shoes that:  Do not have high heels.  Have rubber bottoms.  Are comfortable and fit you well.  Are closed  at the toe. Do not wear sandals.  If you use a stepladder:  Make sure that it is fully opened. Do not climb a closed stepladder.  Make sure that both sides of the stepladder are locked into place.  Ask someone to hold it for you, if possible.  Clearly mark and make sure that you can see:  Any grab bars or handrails.  First and last steps.  Where the edge of each step is.  Use tools that help you move around (mobility aids) if they are needed. These include:  Canes.  Walkers.  Scooters.  Crutches.  Turn on the lights when you go into a dark area. Replace any light bulbs as soon as they burn out.  Set up your furniture so you have a clear path. Avoid moving your furniture around.  If any of your floors are uneven, fix them.  If there are any pets around you, be aware of where they are.  Review your medicines with your doctor. Some medicines can make you feel dizzy. This can increase your chance of falling. Ask your doctor what other things that you can do to help prevent falls. This information is not intended to replace advice given to you by your health care provider. Make sure you discuss any questions you have with your health care provider. Document Released: 09/16/2009 Document Revised: 04/27/2016 Document Reviewed: 12/25/2014 Elsevier Interactive Patient Education  2017 [employer].

## 2021-03-22 ENCOUNTER — Other Ambulatory Visit: Payer: Self-pay | Admitting: *Deleted

## 2021-03-22 DIAGNOSIS — R972 Elevated prostate specific antigen [PSA]: Secondary | ICD-10-CM

## 2021-03-23 ENCOUNTER — Other Ambulatory Visit: Payer: PPO

## 2021-03-23 ENCOUNTER — Other Ambulatory Visit: Payer: Self-pay

## 2021-03-23 DIAGNOSIS — R972 Elevated prostate specific antigen [PSA]: Secondary | ICD-10-CM | POA: Diagnosis not present

## 2021-03-24 ENCOUNTER — Encounter: Payer: Self-pay | Admitting: Urology

## 2021-03-24 LAB — PSA: Prostate Specific Ag, Serum: 15.2 ng/mL — ABNORMAL HIGH (ref 0.0–4.0)

## 2021-04-01 ENCOUNTER — Encounter: Payer: PPO | Admitting: Family Medicine

## 2021-05-05 ENCOUNTER — Ambulatory Visit (INDEPENDENT_AMBULATORY_CARE_PROVIDER_SITE_OTHER): Payer: PPO | Admitting: Family Medicine

## 2021-05-05 ENCOUNTER — Other Ambulatory Visit: Payer: Self-pay

## 2021-05-05 ENCOUNTER — Encounter: Payer: Self-pay | Admitting: Family Medicine

## 2021-05-05 VITALS — BP 125/69 | HR 98 | Temp 98.5°F | Resp 16 | Ht 71.0 in | Wt 157.7 lb

## 2021-05-05 DIAGNOSIS — K22710 Barrett's esophagus with low grade dysplasia: Secondary | ICD-10-CM

## 2021-05-05 DIAGNOSIS — N401 Enlarged prostate with lower urinary tract symptoms: Secondary | ICD-10-CM

## 2021-05-05 DIAGNOSIS — E7800 Pure hypercholesterolemia, unspecified: Secondary | ICD-10-CM | POA: Diagnosis not present

## 2021-05-05 DIAGNOSIS — Z0000 Encounter for general adult medical examination without abnormal findings: Secondary | ICD-10-CM

## 2021-05-05 DIAGNOSIS — R351 Nocturia: Secondary | ICD-10-CM | POA: Diagnosis not present

## 2021-05-05 DIAGNOSIS — I10 Essential (primary) hypertension: Secondary | ICD-10-CM

## 2021-05-05 MED ORDER — OMEPRAZOLE 20 MG PO CPDR: 20.0000 mg | DELAYED_RELEASE_CAPSULE | Freq: Two times a day (BID) | ORAL | 3 refills | Status: AC

## 2021-05-05 NOTE — Progress Notes (Signed)
Complete physical exam   Patient: Mike Spence   DOB: [birth year]   75 y.o. Male  MRN: [id number] Visit Date: 05/05/2021  Today's healthcare provider: [name], PA-C   Chief Complaint  Patient presents with  . Annual Exam   Subjective    Mike Spence is a 75 y.o. male who presents today for a complete physical exam.  He reports consuming a general diet. Gym/ health club routine includes cardio, high impact aerobics , swimming and yoga. He generally feels well. He reports sleeping well. He does not have additional problems to discuss today.  HPI  02/03/2021 AWV  Past Medical History:  Diagnosis Date  . Anxiety   . Barrett's esophagus   . Colon adenomas   . Diaphragmatic hernia   . Dysrhythmia   . GERD (gastroesophageal reflux disease)   . Herniated nucleus pulposus of lumbosacral region   . Hypertension   . Migraines   . Palpitations    Hx of them- identified as PVC's. did a holter monitor in 2/10 showing frequent PVC's. Additionally, the patient had an echcardiogram done in 2/10. EF 55%. there was some abnormal septal motion that may have been due to frequent PVC's. there was no significant valvular disorder and the RV was normal in size and function.  . Peripheral neuropathy   . Varicose vein of leg    Past Surgical History:  Procedure Laterality Date  . COLONOSCOPY WITH PROPOFOL N/A 07/06/2016   Procedure: COLONOSCOPY WITH PROPOFOL;  Surgeon: Manya Silvas, MD;  Location: [facility] ENDOSCOPY;  Service: Endoscopy;  Laterality: N/A;  . COLONOSCOPY WITH PROPOFOL N/A 01/22/2020   Procedure: COLONOSCOPY WITH PROPOFOL;  Surgeon: Toledo, Benay Pike, MD;  Location: [facility] ENDOSCOPY;  Service: Gastroenterology;  Laterality: N/A;  . ESOPHAGOGASTRODUODENOSCOPY (EGD) WITH PROPOFOL N/A 07/06/2016   Procedure: ESOPHAGOGASTRODUODENOSCOPY (EGD) WITH PROPOFOL;  Surgeon: Manya Silvas, MD;  Location: [facility] ENDOSCOPY;  Service: Endoscopy;  Laterality: N/A;  . PROSTATE BIOPSY    .  RETINAL DETACHMENT SURGERY  2008  . TONSILLECTOMY AND ADENOIDECTOMY  1950's  . VARICOSE VEIN SURGERY  2013   Social History   Socioeconomic History  . Marital status: Married    Spouse name: Not on file  . Number of children: 2  . Years of education: Not on file  . Highest education level: Bachelor's degree (e.g., BA, AB, BS)  Occupational History  . Occupation: retired  Tobacco Use  . Smoking status: Former Smoker    Years: 1.00    Types: Cigarettes  . Smokeless tobacco: Never Used  . Tobacco comment: >50 years ago in college  Vaping Use  . Vaping Use: Never used  Substance and Sexual Activity  . Alcohol use: Yes    Alcohol/week: 0.0 standard drinks    Comment: glass of wine every 6 months  . Drug use: No  . Sexual activity: Not Currently  Other Topics Concern  . Not on file  Social History Narrative   Married and lives in [place] with wife. Works for Doctor, general practice.    Social Determinants of Health   Financial Resource Strain: Low Risk   . Difficulty of Paying Living Expenses: Not hard at all  Food Insecurity: No Food Insecurity  . Worried About Charity fundraiser in the Last Year: Never true  . Ran Out of Food in the Last Year: Never true  Transportation Needs: No Transportation Needs  . Lack of Transportation (Medical): No  . Lack of Transportation (Non-Medical):  No  Physical Activity: Sufficiently Active  . Days of Exercise per Week: 3 days  . Minutes of Exercise per Session: 50 min  Stress: No Stress Concern Present  . Feeling of Stress : Not at all  Social Connections: Socially Integrated  . Frequency of Communication with Friends and Family: Three times a week  . Frequency of Social Gatherings with Friends and Family: More than three times a week  . Attends Religious Services: More than 4 times per year  . Active Member of Clubs or Organizations: Yes  . Attends Archivist Meetings: More than 4 times per year  . Marital Status: Married   Human resources officer Violence: Not At Risk  . Fear of Current or Ex-Partner: No  . Emotionally Abused: No  . Physically Abused: No  . Sexually Abused: No   Family Status  Relation Name Status  . Mother  Deceased at age 28  . Father  Deceased at age 5  . Sister  Deceased  . Daughter  Alive  . Son  Alive  . MGM  Deceased  . MGF  Deceased  . PGM  Deceased  . PGF  Deceased  . Neg Hx  (Not Specified)   Family History  Problem Relation Age of Onset  . Hypertension Mother   . Dementia Mother   . Colon cancer Mother   . Kidney disease Father   . Hypertension Father   . Diabetes Father   . Dementia Father   . Colon cancer Sister   . Mitral valve prolapse Daughter   . Anxiety disorder Daughter   . Ulcerative colitis Son   . Heart attack Maternal Grandfather   . Colon cancer Paternal Grandmother   . CVA Paternal Grandfather   . Coronary artery disease Neg Hx    Allergies  Allergen Reactions  . Aspirin     REACTION: upsets stomach    Patient Care Team: Elleana Stillson, Vickki Muff, PA-C as PCP - General (Physician Assistant) Birder Robson, MD as Referring Physician (Ophthalmology) Abbie Sons, MD (Urology) Efrain Sella, MD as Consulting Physician (Gastroenterology)   Medications: Outpatient Medications Prior to Visit  Medication Sig  . calcium citrate (CALCITRATE - DOSED IN MG ELEMENTAL CALCIUM) 950 (200 Ca) MG tablet Take 200 mg of elemental calcium by mouth daily.  . CHOLECALCIFEROL PO Take by mouth.  Marland Kitchen MAGNESIUM CITRATE PO Take by mouth daily at 6 (six) AM.  . Multiple Vitamin (MULTIVITAMIN) capsule Take 1 capsule by mouth daily.  Marland Kitchen omeprazole (PRILOSEC) 20 MG capsule Take 10 mg by mouth daily.  . [DISCONTINUED] b complex vitamins capsule Take 1 capsule by mouth daily. (Patient not taking: Reported on 02/03/2021)  . [DISCONTINUED] LORazepam (ATIVAN) 1 MG tablet Take 1 tablet (1 mg total) by mouth at bedtime. As needed.  . [DISCONTINUED] tamsulosin (FLOMAX) 0.4 MG  CAPS capsule Take 1 capsule (0.4 mg total) by mouth daily. (Patient not taking: Reported on 02/03/2021)   No facility-administered medications prior to visit.    Review of Systems  Constitutional: Negative.   HENT: Negative.   Eyes: Negative.   Respiratory: Negative.   Cardiovascular: Negative.   Gastrointestinal: Negative.   Endocrine: Negative.   Genitourinary: Negative.   Musculoskeletal: Negative.   Skin: Negative.   Allergic/Immunologic: Negative.   Neurological: Negative.   Hematological: Negative.   Psychiatric/Behavioral: Negative.       Objective    BP 125/69 (BP Location: Left Arm, Patient Position: Sitting, Cuff Size: Normal)   Pulse 98  Temp 98.5 F (36.9 C) (Oral)   Resp 16   Ht 5\' 11"  (1.803 m)   Wt 157 lb 11.2 oz (71.5 kg)   BMI 21.99 kg/m  BP Readings from Last 3 Encounters:  05/05/21 125/69  09/15/20 (!) 166/87  07/23/20 (!) 149/76   Wt Readings from Last 3 Encounters:  05/05/21 157 lb 11.2 oz (71.5 kg)  09/15/20 150 lb (68 kg)  07/23/20 150 lb (68 kg)      Physical Exam Constitutional:      Appearance: Normal appearance. He is normal weight.  HENT:     Head: Normocephalic and atraumatic.     Right Ear: Tympanic membrane, ear canal and external ear normal.     Left Ear: Tympanic membrane, ear canal and external ear normal.     Nose: Nose normal.     Mouth/Throat:     Mouth: Mucous membranes are moist.     Pharynx: Oropharynx is clear.  Eyes:     Extraocular Movements: Extraocular movements intact.     Conjunctiva/sclera: Conjunctivae normal.     Pupils: Pupils are equal, round, and reactive to light.  Cardiovascular:     Rate and Rhythm: Normal rate and regular rhythm.     Pulses: Normal pulses.     Heart sounds: Normal heart sounds.  Pulmonary:     Effort: Pulmonary effort is normal.     Breath sounds: Normal breath sounds.  Abdominal:     General: Abdomen is flat. Bowel sounds are normal.     Palpations: Abdomen is soft.   Genitourinary:    Comments: Exam deferred to Dr. Bernardo Heater. Musculoskeletal:        General: Normal range of motion.     Cervical back: Normal range of motion and neck supple.     Comments: Large varicose veins both legs.  Skin:    General: Skin is warm and dry.  Neurological:     General: No focal deficit present.     Mental Status: He is alert and oriented to person, place, and time. Mental status is at baseline.  Psychiatric:        Mood and Affect: Mood normal.        Behavior: Behavior normal.        Thought Content: Thought content normal.        Judgment: Judgment normal.       Last depression screening scores PHQ 2/9 Scores 05/05/2021 02/03/2021 02/02/2020  PHQ - 2 Score 0 0 0  PHQ- 9 Score 0 - -   Last fall risk screening Fall Risk  05/05/2021  Falls in the past year? 0  Number falls in past yr: 0  Injury with Fall? 0  Risk for fall due to : No Fall Risks  Follow up Falls evaluation completed   Last Audit-C alcohol use screening Alcohol Use Disorder Test (AUDIT) 05/05/2021  1. How often do you have a drink containing alcohol? 0  2. How many drinks containing alcohol do you have on a typical day when you are drinking? 0  3. How often do you have six or more drinks on one occasion? 0  AUDIT-C Score 0  4. How often during the last year have you found that you were not able to stop drinking once you had started? -  5. How often during the last year have you failed to do what was normally expected from you because of drinking? -  6. How often during the last year have you  needed a first drink in the morning to get yourself going after a heavy drinking session? -  7. How often during the last year have you had a feeling of guilt of remorse after drinking? -  8. How often during the last year have you been unable to remember what happened the night before because you had been drinking? -  9. Have you or someone else been injured as a result of your drinking? -  10. Has a relative  or friend or a doctor or another health worker been concerned about your drinking or suggested you cut down? -  Alcohol Use Disorder Identification Test Final Score (AUDIT) -  Alcohol Brief Interventions/Follow-up -   A score of 3 or more in women, and 4 or more in men indicates increased risk for alcohol abuse, EXCEPT if all of the points are from question 1   No results found for any visits on 05/05/21.  Assessment & Plan    Routine Health Maintenance and Physical Exam  Exercise Activities and Dietary recommendations Goals    . DIET - INCREASE WATER INTAKE     Recommend increasing water intake to 6-8 8 oz glasses a day.        Immunization History  Administered Date(s) Administered  . Fluad Quad(high Dose 65+) 09/10/2019, 09/23/2020  . Influenza, High Dose Seasonal PF 09/20/2015, 01/03/2017, 01/17/2018, 01/14/2019  . PFIZER(Purple Top)SARS-COV-2 Vaccination 12/15/2019, 01/04/2020, 09/09/2020  . Pneumococcal Conjugate-13 05/07/2014  . Pneumococcal Polysaccharide-23 09/20/2015  . Tdap 07/04/2011    Health Maintenance  Topic Date Due  . Zoster Vaccines- Shingrix (1 of 2) Never done  . TETANUS/TDAP  07/03/2021  . INFLUENZA VACCINE  07/04/2021  . COLONOSCOPY (Pts 45-47yrs Insurance coverage will need to be confirmed)  01/21/2023  . COVID-19 Vaccine  Completed  . Hepatitis C Screening  Completed  . PNA vac Low Risk Adult  Completed  . HPV VACCINES  Aged Out    Discussed health benefits of physical activity, and encouraged him to engage in regular exercise appropriate for his age and condition.  1. Encounter for annual health examination Good general health. Counseled regarding health maintenance. Immunizations up to date. - CBC with Differential/Platelet - Lipid Panel With LDL/HDL Ratio - Comprehensive metabolic panel  2. Essential (primary) hypertension Well controlled by diet and lifestyle changes. Recheck labs. - CBC with Differential/Platelet - Comprehensive  metabolic panel  3. Hypercholesterolemia Recheck labs. Refuses statins. Continues diet and regular exercise. - Lipid Panel With LDL/HDL Ratio  4. Barrett's esophagus with low grade dysplasia Stable dyspepsia with BID use of Omeprazole 20 mg. No melena or hematemesis. Recheck labs and followed by Dr. Alice Reichert (GI). - CBC with Differential/Platelet - omeprazole (PRILOSEC) 20 MG capsule; Take 1 capsule (20 mg total) by mouth 2 (two) times daily before a meal.  Dispense: 180 capsule; Refill: 3  5. Benign prostatic hyperplasia with nocturia Nocturia 3 times a night. Followed by Dr. Bernardo Heater (urologist) with PSA around 15 now. - CBC with Differential/Platelet - Comprehensive metabolic panel    No follow-ups on file.      [name], PA-C  [employer] [phone] (phone) [phone] (fax)  [place]

## 2021-05-05 NOTE — Patient Instructions (Signed)
Preventive Care 77 Years and Older, Male Preventive care refers to lifestyle choices and visits with your health care provider that can promote health and wellness. This includes:  A yearly physical exam. This is also called an annual wellness visit.  Regular dental and eye exams.  Immunizations.  Screening for certain conditions.  Healthy lifestyle choices, such as: ? Eating a healthy diet. ? Getting regular exercise. ? Not using drugs or products that contain nicotine and tobacco. ? Limiting alcohol use. What can I expect for my preventive care visit? Physical exam Your health care provider will check your:  Height and weight. These may be used to calculate your BMI (body mass index). BMI is a measurement that tells if you are at a healthy weight.  Heart rate and blood pressure.  Body temperature.  Skin for abnormal spots. Counseling Your health care provider may ask you questions about your:  Past medical problems.  Family's medical history.  Alcohol, tobacco, and drug use.  Emotional well-being.  Home life and relationship well-being.  Sexual activity.  Diet, exercise, and sleep habits.  History of falls.  Memory and ability to understand (cognition).  Work and work environment.  Access to firearms. What immunizations do I need? Vaccines are usually given at various ages, according to a schedule. Your health care provider will recommend vaccines for you based on your age, medical history, and lifestyle or other factors, such as travel or where you work.   What tests do I need? Blood tests  Lipid and cholesterol levels. These may be checked every 5 years, or more often depending on your overall health.  Hepatitis C test.  Hepatitis B test. Screening  Lung cancer screening. You may have this screening every year starting at age 55 if you have a 30-pack-year history of smoking and currently smoke or have quit within the past 15 years.  Colorectal  cancer screening. ? All adults should have this screening starting at age 50 and continuing until age 75. ? Your health care provider may recommend screening at age 45 if you are at increased risk. ? You will have tests every 1-10 years, depending on your results and the type of screening test.  Prostate cancer screening. Recommendations will vary depending on your family history and other risks.  Genital exam to check for testicular cancer or hernias.  Diabetes screening. ? This is done by checking your blood sugar (glucose) after you have not eaten for a while (fasting). ? You may have this done every 1-3 years.  Abdominal aortic aneurysm (AAA) screening. You may need this if you are a current or former smoker.  STD (sexually transmitted disease) testing, if you are at risk. Follow these instructions at home: Eating and drinking  Eat a diet that includes fresh fruits and vegetables, whole grains, lean protein, and low-fat dairy products. Limit your intake of foods with high amounts of sugar, saturated fats, and salt.  Take vitamin and mineral supplements as recommended by your health care provider.  Do not drink alcohol if your health care provider tells you not to drink.  If you drink alcohol: ? Limit how much you have to 0-2 drinks a day. ? Be aware of how much alcohol is in your drink. In the [place] one drink equals one 12 oz bottle of beer (355 mL), one 5 oz glass of wine (148 mL), or one 1 oz glass of hard liquor (44 mL).   Lifestyle  Take daily care of your teeth   and gums. Brush your teeth every morning and night with fluoride toothpaste. Floss one time each day.  Stay active. Exercise for at least 30 minutes 5 or more days each week.  Do not use any products that contain nicotine or tobacco, such as cigarettes, e-cigarettes, and chewing tobacco. If you need help quitting, ask your health care provider.  Do not use drugs.  If you are sexually active, practice safe sex.  Use a condom or other form of protection to prevent STIs (sexually transmitted infections).  Talk with your health care provider about taking a low-dose aspirin or statin.  Find healthy ways to cope with stress, such as: ? Meditation, yoga, or listening to music. ? Journaling. ? Talking to a trusted person. ? Spending time with friends and family. Safety  Always wear your seat belt while driving or riding in a vehicle.  Do not drive: ? If you have been drinking alcohol. Do not ride with someone who has been drinking. ? When you are tired or distracted. ? While texting.  Wear a helmet and other protective equipment during sports activities.  If you have firearms in your house, make sure you follow all gun safety procedures. What's next?  Visit your health care provider once a year for an annual wellness visit.  Ask your health care provider how often you should have your eyes and teeth checked.  Stay up to date on all vaccines. This information is not intended to replace advice given to you by your health care provider. Make sure you discuss any questions you have with your health care provider. Document Revised: 08/19/2019 Document Reviewed: 11/14/2018 Elsevier Patient Education  2021 Elsevier Inc.  

## 2021-05-09 ENCOUNTER — Other Ambulatory Visit: Payer: Self-pay | Admitting: *Deleted

## 2021-05-09 DIAGNOSIS — Z0000 Encounter for general adult medical examination without abnormal findings: Secondary | ICD-10-CM | POA: Diagnosis not present

## 2021-05-09 DIAGNOSIS — R351 Nocturia: Secondary | ICD-10-CM | POA: Diagnosis not present

## 2021-05-09 DIAGNOSIS — E7800 Pure hypercholesterolemia, unspecified: Secondary | ICD-10-CM | POA: Diagnosis not present

## 2021-05-09 DIAGNOSIS — N401 Enlarged prostate with lower urinary tract symptoms: Secondary | ICD-10-CM | POA: Diagnosis not present

## 2021-05-09 DIAGNOSIS — R972 Elevated prostate specific antigen [PSA]: Secondary | ICD-10-CM

## 2021-05-09 DIAGNOSIS — K22710 Barrett's esophagus with low grade dysplasia: Secondary | ICD-10-CM | POA: Diagnosis not present

## 2021-05-09 DIAGNOSIS — I10 Essential (primary) hypertension: Secondary | ICD-10-CM | POA: Diagnosis not present

## 2021-05-10 LAB — COMPREHENSIVE METABOLIC PANEL
ALT: 17 IU/L (ref 0–44)
AST: 24 IU/L (ref 0–40)
Albumin/Globulin Ratio: 1.7 (ref 1.2–2.2)
Albumin: 4.4 g/dL (ref 3.7–4.7)
Alkaline Phosphatase: 72 IU/L (ref 44–121)
BUN/Creatinine Ratio: 19 (ref 10–24)
BUN: 22 mg/dL (ref 8–27)
Bilirubin Total: 0.4 mg/dL (ref 0.0–1.2)
CO2: 25 mmol/L (ref 20–29)
Calcium: 9.1 mg/dL (ref 8.6–10.2)
Chloride: 103 mmol/L (ref 96–106)
Creatinine, Ser: 1.15 mg/dL (ref 0.76–1.27)
Globulin, Total: 2.6 g/dL (ref 1.5–4.5)
Glucose: 97 mg/dL (ref 65–99)
Potassium: 4.5 mmol/L (ref 3.5–5.2)
Sodium: 141 mmol/L (ref 134–144)
Total Protein: 7 g/dL (ref 6.0–8.5)
eGFR: 66 mL/min/{1.73_m2} (ref >59)

## 2021-05-10 LAB — CBC WITH DIFFERENTIAL/PLATELET
Basophils Absolute: 0 10*3/uL (ref 0.0–0.2)
Basos: 1 %
EOS (ABSOLUTE): 0.1 10*3/uL (ref 0.0–0.4)
Eos: 1 %
Hematocrit: 38.1 % (ref 37.5–51.0)
Hemoglobin: 13.1 g/dL (ref 13.0–17.7)
Immature Grans (Abs): 0 10*3/uL (ref 0.0–0.1)
Immature Granulocytes: 0 %
Lymphocytes Absolute: 2.2 10*3/uL (ref 0.7–3.1)
Lymphs: 46 %
MCH: 33.1 pg — ABNORMAL HIGH (ref 26.6–33.0)
MCHC: 34.4 g/dL (ref 31.5–35.7)
MCV: 96 fL (ref 79–97)
Monocytes Absolute: 0.5 10*3/uL (ref 0.1–0.9)
Monocytes: 10 %
Neutrophils Absolute: 2 10*3/uL (ref 1.4–7.0)
Neutrophils: 42 %
Platelets: 192 10*3/uL (ref 150–450)
RBC: 3.96 x10E6/uL — ABNORMAL LOW (ref 4.14–5.80)
RDW: 11.7 % (ref 11.6–15.4)
WBC: 4.8 10*3/uL (ref 3.4–10.8)

## 2021-05-10 LAB — LIPID PANEL WITH LDL/HDL RATIO
Cholesterol, Total: 201 mg/dL — ABNORMAL HIGH (ref 100–199)
HDL: 71 mg/dL (ref >39)
LDL Chol Calc (NIH): 122 mg/dL — ABNORMAL HIGH (ref 0–99)
LDL/HDL Ratio: 1.7 ratio (ref 0.0–3.6)
Triglycerides: 45 mg/dL (ref 0–149)
VLDL Cholesterol Cal: 8 mg/dL (ref 5–40)

## 2021-06-16 ENCOUNTER — Encounter: Payer: Self-pay | Admitting: Urology

## 2021-06-16 ENCOUNTER — Ambulatory Visit (INDEPENDENT_AMBULATORY_CARE_PROVIDER_SITE_OTHER): Payer: PPO | Admitting: Urology

## 2021-06-16 ENCOUNTER — Other Ambulatory Visit: Payer: Self-pay

## 2021-06-16 VITALS — BP 161/90 | HR 73 | Ht 71.0 in | Wt 150.0 lb

## 2021-06-16 DIAGNOSIS — R351 Nocturia: Secondary | ICD-10-CM | POA: Diagnosis not present

## 2021-06-16 DIAGNOSIS — R3911 Hesitancy of micturition: Secondary | ICD-10-CM | POA: Diagnosis not present

## 2021-06-16 DIAGNOSIS — N401 Enlarged prostate with lower urinary tract symptoms: Secondary | ICD-10-CM

## 2021-06-16 LAB — URINALYSIS, COMPLETE
Bilirubin, UA: NEGATIVE
Glucose, UA: NEGATIVE
Ketones, UA: NEGATIVE
Nitrite, UA: NEGATIVE
Protein,UA: NEGATIVE
Specific Gravity, UA: 1.02 (ref 1.005–1.030)
Urobilinogen, Ur: 0.2 mg/dL (ref 0.2–1.0)
pH, UA: 7 (ref 5.0–7.5)

## 2021-06-16 LAB — MICROSCOPIC EXAMINATION

## 2021-06-16 LAB — BLADDER SCAN AMB NON-IMAGING: Scan Result: 29

## 2021-06-16 MED ORDER — TAMSULOSIN HCL 0.4 MG PO CAPS: 0.4000 mg | ORAL_CAPSULE | Freq: Every day | ORAL | 1 refills | Status: DC

## 2021-06-16 NOTE — Progress Notes (Signed)
06/16/2021 3:01 PM   Mike Spence 1946/10/09 [id number]  Referring provider: Margo Common, PA-C [address] [place],  [place] [address]  Chief Complaint  Patient presents with   Urinary Frequency    Urologic history: 1.  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 male called for an acute visit for worsening lower urinary tract symptoms.  2-week history of urinary hesitancy and several episodes of nocturia nightly Urinary hesitancy is worse at night and states it is painful when he is trying to initiate the stream Daytime symptoms present but not as severe Denies fever, chills, gross hematuria PCP started tamsulosin ~ 5 days ago and he has noted significant improvement in his symptoms   PMH: Past Medical History:  Diagnosis Date   Anxiety    Barrett's esophagus    Colon adenomas    Diaphragmatic hernia    Dysrhythmia    GERD (gastroesophageal reflux disease)    Herniated nucleus pulposus of lumbosacral region    Hypertension    Migraines    Palpitations    Hx of them- identified as PVC's. did a holter monitor in 2/10 showing frequent PVC's. Additionally, the patient had an echcardiogram done in 2/10. EF 55%. there was some abnormal septal motion that may have been due to frequent PVC's. there was no significant valvular disorder and the RV was normal in size and function.   Peripheral neuropathy    Varicose vein of leg     Surgical History: Past Surgical History:  Procedure Laterality Date   COLONOSCOPY WITH PROPOFOL N/A 07/06/2016   Procedure: COLONOSCOPY WITH PROPOFOL;  Surgeon: Manya Silvas, MD;  Location: [facility] ENDOSCOPY;  Service: Endoscopy;  Laterality: N/A;   COLONOSCOPY WITH PROPOFOL  N/A 01/22/2020   Procedure: COLONOSCOPY WITH PROPOFOL;  Surgeon: Toledo, Benay Pike, MD;  Location: [facility] ENDOSCOPY;  Service: Gastroenterology;  Laterality: N/A;   ESOPHAGOGASTRODUODENOSCOPY (EGD) WITH PROPOFOL N/A 07/06/2016   Procedure: ESOPHAGOGASTRODUODENOSCOPY (EGD) WITH PROPOFOL;  Surgeon: Manya Silvas, MD;  Location[facility] ENDOSCOPY;  Service: Endoscopy;  Laterality: N/A;   PROSTATE BIOPSY     RETINAL DETACHMENT SURGERY  2008   TONSILLECTOMY AND ADENOIDECTOMY  1950's   VARICOSE VEIN SURGERY  2013    Home Medications:  Allergies as of 06/16/2021       Reactions   Aspirin    REACTION: upsets stomach        Medication List        Accurate as of June 16, 2021  3:01 PM. If you have any questions, ask your nurse or doctor.          calcium citrate 950 (200 Ca) MG tablet Commonly known as: CALCITRATE - dosed in mg elemental calcium Take 200 mg of elemental calcium by mouth daily.   CHOLECALCIFEROL PO Take by mouth.   MAGNESIUM CITRATE PO Take by mouth daily at 6 (six) AM.   multivitamin capsule Take 1 capsule by mouth daily.   omeprazole 20 MG capsule Commonly known as: PRILOSEC Take 1 capsule (20 mg total) by mouth 2 (two) times daily before a meal.        Allergies:  Allergies  Allergen Reactions   Aspirin     REACTION: upsets stomach  Family History: Family History  Problem Relation Age of Onset   Hypertension Mother    Dementia Mother    Colon cancer Mother    Kidney disease Father    Hypertension Father    Diabetes Father    Dementia Father    Colon cancer Sister    Mitral valve prolapse Daughter    Anxiety disorder Daughter    Ulcerative colitis Son    Heart attack Maternal Grandfather    Colon cancer Paternal Grandmother    CVA Paternal Grandfather    Coronary artery disease Neg Hx     Social History:  reports that he has quit smoking. His smoking use included cigarettes. He has never used smokeless tobacco. He reports current  alcohol use. He reports that he does not use drugs.   Physical Exam: BP (!) 161/90   Pulse (!) 3   Ht 5\' 11"  (1.803 m)   Wt 150 lb (68 kg)   BMI 20.92 kg/m   Constitutional:  Alert and oriented, No acute distress. HEENT: [place] AT, moist mucus membranes.  Trachea midline, no masses. Cardiovascular: No clubbing, cyanosis, or edema. Respiratory: Normal respiratory effort, no increased work of breathing.  Laboratory Data:  Urinalysis Dipstick 1+ leukocytes Microscopy 6-10 WBC/3-10 RBC  Assessment & Plan:    1. Benign prostatic hyperplasia with LUTS Bothersome urinary hesitancy and nocturia Significant improvement on tamsulosin Urine sample returned after patient had left and does show pyuria A urine culture was ordered however the specimen had been discarded and patient was contacted to bring in another specimen for culture He will be notified with results and possible need for antibiotics Consider CT if culture negative   Abbie Sons, MD  [facility] [address], [place], [place] [address] [phone]

## 2021-06-17 ENCOUNTER — Other Ambulatory Visit: Payer: Self-pay

## 2021-06-17 ENCOUNTER — Other Ambulatory Visit: Payer: PPO

## 2021-06-17 DIAGNOSIS — R351 Nocturia: Secondary | ICD-10-CM | POA: Diagnosis not present

## 2021-06-17 DIAGNOSIS — N401 Enlarged prostate with lower urinary tract symptoms: Secondary | ICD-10-CM | POA: Diagnosis not present

## 2021-06-18 ENCOUNTER — Encounter: Payer: Self-pay | Admitting: Urology

## 2021-06-24 ENCOUNTER — Other Ambulatory Visit: Payer: Self-pay | Admitting: *Deleted

## 2021-06-24 LAB — CULTURE, URINE COMPREHENSIVE

## 2021-06-24 MED ORDER — SULFAMETHOXAZOLE-TRIMETHOPRIM 800-160 MG PO TABS: 1.0000 | ORAL_TABLET | Freq: Two times a day (BID) | ORAL | 0 refills | Status: AC

## 2021-06-24 NOTE — Telephone Encounter (Signed)
Please notify patient that his final urine culture report returned today. Please send in Rx Septra DS 1 tab twice daily x14 days. Notified patient as instructed, patient pleased. Discussed follow-up appointments, patient agrees

## 2021-06-28 ENCOUNTER — Other Ambulatory Visit: Payer: Self-pay

## 2021-07-15 DIAGNOSIS — H2513 Age-related nuclear cataract, bilateral: Secondary | ICD-10-CM | POA: Diagnosis not present

## 2021-07-21 ENCOUNTER — Other Ambulatory Visit: Payer: PPO

## 2021-07-21 ENCOUNTER — Telehealth: Payer: Self-pay

## 2021-07-21 ENCOUNTER — Other Ambulatory Visit: Payer: Self-pay

## 2021-07-21 DIAGNOSIS — R972 Elevated prostate specific antigen [PSA]: Secondary | ICD-10-CM

## 2021-07-21 NOTE — Telephone Encounter (Signed)
Patient was in clinic getting labs drawn. He states since taking flomax his BP has dropped 90/60 I advised pt to DC flomax.

## 2021-07-22 LAB — PSA: Prostate Specific Ag, Serum: 21.8 ng/mL — ABNORMAL HIGH (ref 0.0–4.0)

## 2021-07-27 ENCOUNTER — Other Ambulatory Visit: Payer: Self-pay

## 2021-07-27 DIAGNOSIS — N401 Enlarged prostate with lower urinary tract symptoms: Secondary | ICD-10-CM

## 2021-07-28 ENCOUNTER — Other Ambulatory Visit: Payer: PPO

## 2021-07-28 ENCOUNTER — Other Ambulatory Visit: Payer: Self-pay

## 2021-07-28 DIAGNOSIS — N401 Enlarged prostate with lower urinary tract symptoms: Secondary | ICD-10-CM | POA: Diagnosis not present

## 2021-07-28 DIAGNOSIS — R351 Nocturia: Secondary | ICD-10-CM | POA: Diagnosis not present

## 2021-07-29 LAB — URINALYSIS, COMPLETE
Bilirubin, UA: NEGATIVE
Glucose, UA: NEGATIVE
Ketones, UA: NEGATIVE
Leukocytes,UA: NEGATIVE
Nitrite, UA: NEGATIVE
Protein,UA: NEGATIVE
Specific Gravity, UA: 1.015 (ref 1.005–1.030)
Urobilinogen, Ur: 0.2 mg/dL (ref 0.2–1.0)
pH, UA: 7 (ref 5.0–7.5)

## 2021-07-29 LAB — MICROSCOPIC EXAMINATION
Bacteria, UA: NONE SEEN
Epithelial Cells (non renal): NONE SEEN /hpf (ref 0–10)

## 2021-07-31 LAB — CULTURE, URINE COMPREHENSIVE

## 2021-09-20 ENCOUNTER — Other Ambulatory Visit: Payer: Self-pay

## 2021-09-20 ENCOUNTER — Other Ambulatory Visit: Payer: PPO

## 2021-09-20 DIAGNOSIS — R972 Elevated prostate specific antigen [PSA]: Secondary | ICD-10-CM

## 2021-09-20 DIAGNOSIS — R351 Nocturia: Secondary | ICD-10-CM

## 2021-09-20 DIAGNOSIS — N401 Enlarged prostate with lower urinary tract symptoms: Secondary | ICD-10-CM

## 2021-09-21 LAB — PSA: Prostate Specific Ag, Serum: 19.6 ng/mL — ABNORMAL HIGH (ref 0.0–4.0)

## 2021-09-22 ENCOUNTER — Ambulatory Visit: Payer: Self-pay | Admitting: Urology

## 2021-09-29 ENCOUNTER — Ambulatory Visit: Payer: PPO | Admitting: Urology

## 2021-09-29 ENCOUNTER — Other Ambulatory Visit: Payer: Self-pay

## 2021-09-29 ENCOUNTER — Encounter: Payer: Self-pay | Admitting: Urology

## 2021-09-29 VITALS — BP 133/75 | HR 111 | Ht 71.0 in | Wt 150.0 lb

## 2021-09-29 DIAGNOSIS — R972 Elevated prostate specific antigen [PSA]: Secondary | ICD-10-CM | POA: Diagnosis not present

## 2021-09-29 DIAGNOSIS — N5201 Erectile dysfunction due to arterial insufficiency: Secondary | ICD-10-CM

## 2021-09-29 MED ORDER — TADALAFIL 20 MG PO TABS: ORAL_TABLET | ORAL | 0 refills | Status: AC

## 2021-09-29 NOTE — Progress Notes (Signed)
09/29/2021 1:38 PM   Mike Spence 24-Apr-1946 [id number]  Referring provider: Margo Common, PA-C No address on file  Chief Complaint  Patient presents with   Elevated PSA    Urologic history: 1.  Elevated PSA -Prostate biopsy 06/2018; PSA 5.1; 4K 56% probability high grade cancer -Volume 44 g; left mid core with focus high-grade PIN -PSA bump 6.11 December 2018; prostate MRI March 2020 35 g gland no suspicious lesions -Rebiopsy 09/2020 PSA 11.8; prostate volume 36 g; benign pathology   HPI: 75 y.o. male who presents for follow-up.  Had hypertension with tamsulosin August 2022 and discontinue the medication.  He has not had recurrent voiding symptoms and felt the symptoms were secondary to the infection he had in July 2022 Follow-up PSA 09/20/2021 was 19.6 He did want to discuss treatment for ED.  With some difficulty achieving and maintaining an erection.  Denies use of oral sublingual nitrates.  He is active without shortness of breath/chest pain     PMH: Past Medical History:  Diagnosis Date   Anxiety    Barrett's esophagus    Colon adenomas    Diaphragmatic hernia    Dysrhythmia    GERD (gastroesophageal reflux disease)    Herniated nucleus pulposus of lumbosacral region    Hypertension    Migraines    Palpitations    Hx of them- identified as PVC's. did a holter monitor in 2/10 showing frequent PVC's. Additionally, the patient had an echcardiogram done in 2/10. EF 55%. there was some abnormal septal motion that may have been due to frequent PVC's. there was no significant valvular disorder and the RV was normal in size and function.   Peripheral neuropathy    Varicose vein of leg     Surgical History: Past Surgical History:  Procedure Laterality Date   COLONOSCOPY WITH PROPOFOL N/A 07/06/2016   Procedure: COLONOSCOPY WITH PROPOFOL;  Surgeon: Manya Silvas, MD;  Location: [facility] ENDOSCOPY;  Service: Endoscopy;  Laterality: N/A;   COLONOSCOPY WITH  PROPOFOL N/A 01/22/2020   Procedure: COLONOSCOPY WITH PROPOFOL;  Surgeon: Toledo, Benay Pike, MD;  Location: [facility] ENDOSCOPY;  Service: Gastroenterology;  Laterality: N/A;   ESOPHAGOGASTRODUODENOSCOPY (EGD) WITH PROPOFOL N/A 07/06/2016   Procedure: ESOPHAGOGASTRODUODENOSCOPY (EGD) WITH PROPOFOL;  Surgeon: Manya Silvas, MD;  Location: [facility] ENDOSCOPY;  Service: Endoscopy;  Laterality: N/A;   PROSTATE BIOPSY     RETINAL DETACHMENT SURGERY  2008   TONSILLECTOMY AND ADENOIDECTOMY  1950's   VARICOSE VEIN SURGERY  2013    Home Medications:  Allergies as of 09/29/2021       Reactions   Aspirin    REACTION: upsets stomach        Medication List        Accurate as of September 29, 2021  1:38 PM. If you have any questions, ask your nurse or doctor.          calcium citrate 950 (200 Ca) MG tablet Commonly known as: CALCITRATE - dosed in mg elemental calcium Take 200 mg of elemental calcium by mouth daily.   CHOLECALCIFEROL PO Take by mouth.   MAGNESIUM CITRATE PO Take by mouth daily at 6 (six) AM.   multivitamin capsule Take 1 capsule by mouth daily.   omeprazole 20 MG capsule Commonly known as: PRILOSEC Take 1 capsule (20 mg total) by mouth 2 (two) times daily before a meal.   tamsulosin 0.4 MG Caps capsule Commonly known as: FLOMAX Take 1 capsule (0.4 mg total) by mouth daily.  Allergies:  Allergies  Allergen Reactions   Aspirin     REACTION: upsets stomach    Family History: Family History  Problem Relation Age of Onset   Hypertension Mother    Dementia Mother    Colon cancer Mother    Kidney disease Father    Hypertension Father    Diabetes Father    Dementia Father    Colon cancer Sister    Mitral valve prolapse Daughter    Anxiety disorder Daughter    Ulcerative colitis Son    Heart attack Maternal Grandfather    Colon cancer Paternal Grandmother    CVA Paternal Grandfather    Coronary artery disease Neg Hx     Social History:  reports  that he has quit smoking. His smoking use included cigarettes. He has never used smokeless tobacco. He reports current alcohol use. He reports that he does not use drugs.   Physical Exam: There were no vitals taken for this visit.  Constitutional:  Alert and oriented, No acute distress. HEENT: [place] AT, moist mucus membranes.  Trachea midline, no masses. Cardiovascular: No clubbing, cyanosis, or edema. Respiratory: Normal respiratory effort, no increased work of breathing. GU: Prostate 40 g, smooth without nodules Skin: No rashes, bruises or suspicious lesions. Neurologic: Grossly intact, no focal deficits, moving all 4 extremities. Psychiatric: Normal mood and affect.   Assessment & Plan:    1.  Elevated PSA Follow-up PSA after infection July 2022 remains elevated above baseline at 19.6 Benign DRE Recommend repeat prostate MRI and will call with results  2.  Erectile dysfunction New problem Was interested in a PDE 5 inhibitor and Rx tadalafil 20 mg sent to [place], [place] [address] [place],  [id number] [phone]

## 2021-10-19 DIAGNOSIS — M25552 Pain in left hip: Secondary | ICD-10-CM | POA: Diagnosis not present

## 2021-11-14 DIAGNOSIS — G43909 Migraine, unspecified, not intractable, without status migrainosus: Secondary | ICD-10-CM | POA: Diagnosis not present

## 2021-11-14 DIAGNOSIS — R972 Elevated prostate specific antigen [PSA]: Secondary | ICD-10-CM | POA: Diagnosis not present

## 2021-12-09 ENCOUNTER — Other Ambulatory Visit: Payer: Self-pay | Admitting: Urology

## 2021-12-09 DIAGNOSIS — R972 Elevated prostate specific antigen [PSA]: Secondary | ICD-10-CM

## 2021-12-19 ENCOUNTER — Other Ambulatory Visit: Payer: Self-pay

## 2021-12-19 ENCOUNTER — Ambulatory Visit
Admission: RE | Admit: 2021-12-19 | Discharge: 2021-12-19 | Disposition: A | Discharge status: Home / Self Care | Payer: PPO | Source: Ambulatory Visit | Attending: Urology | Admitting: Urology

## 2021-12-19 DIAGNOSIS — R972 Elevated prostate specific antigen [PSA]: Secondary | ICD-10-CM | POA: Diagnosis present

## 2021-12-19 MED ORDER — GADOBUTROL 1 MMOL/ML IV SOLN
6.0000 mL | Freq: Once | INTRAVENOUS | Status: AC | PRN
  Administered 2021-12-19: 6 mL via INTRAVENOUS

## 2021-12-23 ENCOUNTER — Telehealth: Payer: Self-pay | Admitting: Urology

## 2021-12-23 DIAGNOSIS — R972 Elevated prostate specific antigen [PSA]: Secondary | ICD-10-CM

## 2021-12-23 NOTE — Telephone Encounter (Signed)
I contacted Mr. Powley regarding his prostate MRI.  He had PI-RADS 4 and 5 lesions in the right posterolateral PZ and right anterior TZ/right anterior PZ respectively.  We discussed these are suspicious for high-grade prostate cancer.  We discussed fusion biopsy which I recommended.  He was informed we are using [facility] in [place] to perform these biopsies.  He desires to get the biopsy scheduled.  Referral order was entered and he will return here for the pathology results.

## 2022-01-20 ENCOUNTER — Telehealth: Payer: Self-pay | Admitting: Urology

## 2022-01-20 ENCOUNTER — Other Ambulatory Visit: Payer: Self-pay | Admitting: Urology

## 2022-01-20 DIAGNOSIS — C61 Malignant neoplasm of prostate: Secondary | ICD-10-CM

## 2022-01-20 NOTE — Telephone Encounter (Signed)
I contacted Mike Spence to discuss his prostate biopsy report.  Fusion biopsy was performed in [facility] 01/17/2022.  He had no postbiopsy complaints.  He underwent biopsies x4 of both ROI lesions along with a standard 12 core biopsy.  The ROI biopsies showed no evidence of cancer.  1 did show a small focus of atypical glands.  He had 5/6 cores positive for adenocarcinoma on the right side on the standard template biopsy with the highest grade being Gleason 4+4 (20%) he also had variable pathology of 3+3, 4+3 and 3+4.  Prostate MRI showed no evidence of extracapsular extension or adenopathy.  I did recommend scheduling a bone scan.  I also recommended radiation oncology referral however he wanted to hold off until he got his bone scan results.

## 2022-01-25 ENCOUNTER — Telehealth: Payer: Self-pay | Admitting: Urology

## 2022-01-25 DIAGNOSIS — C61 Malignant neoplasm of prostate: Secondary | ICD-10-CM

## 2022-01-25 NOTE — Telephone Encounter (Signed)
Patient requested referral to radiation oncology either [facility] or [facility].  Referral placed to [facility].  Bone scan is pending

## 2022-02-01 ENCOUNTER — Ambulatory Visit: Admit: 2022-02-01

## 2022-02-02 ENCOUNTER — Encounter
Admission: RE | Admit: 2022-02-02 | Discharge: 2022-02-02 | Disposition: A | Discharge status: Home / Self Care | Payer: PPO | Source: Ambulatory Visit | Attending: Urology | Admitting: Urology

## 2022-02-02 ENCOUNTER — Other Ambulatory Visit: Payer: Self-pay

## 2022-02-02 DIAGNOSIS — C61 Malignant neoplasm of prostate: Principal | ICD-10-CM

## 2022-02-02 MED ORDER — TECHNETIUM TC 99M MEDRONATE IV KIT
20.0000 | PACK | Freq: Once | INTRAVENOUS | Status: AC | PRN
  Administered 2022-02-02: 21.3 via INTRAVENOUS

## 2022-02-05 ENCOUNTER — Encounter: Payer: Self-pay | Admitting: Urology

## 2022-02-07 ENCOUNTER — Ambulatory Visit: Admit: 2022-02-07 | Discharge: 2022-02-08 | Payer: PRIVATE HEALTH INSURANCE

## 2022-02-09 ENCOUNTER — Encounter: Admit: 2022-02-09 | Discharge: 2022-02-09 | Payer: PRIVATE HEALTH INSURANCE

## 2022-02-09 DIAGNOSIS — C61 Malignant neoplasm of prostate: Principal | ICD-10-CM

## 2022-02-13 ENCOUNTER — Ambulatory Visit
Admit: 2022-02-13 | Discharge: 2022-02-13 | Payer: PRIVATE HEALTH INSURANCE | Attending: Student in an Organized Health Care Education/Training Program | Primary: Student in an Organized Health Care Education/Training Program

## 2022-02-13 ENCOUNTER — Ambulatory Visit: Admit: 2022-02-13 | Discharge: 2022-02-13 | Payer: PRIVATE HEALTH INSURANCE

## 2022-02-13 DIAGNOSIS — C61 Malignant neoplasm of prostate: Principal | ICD-10-CM

## 2022-02-13 DIAGNOSIS — R82998 Other abnormal findings in urine: Principal | ICD-10-CM

## 2022-02-13 MED ORDER — RELUGOLIX 120 MG TABLET
ORAL_TABLET | Freq: Every day | ORAL | 11 refills | 30.00000 days | Status: CP
Start: 2022-02-13 — End: 2023-02-13
  Filled 2022-02-20: qty 30, 28d supply, fill #0

## 2022-02-13 NOTE — Unmapped (Signed)
Radiation Oncology Prostate Cancer Consultation Note    Patient Name: Greg Anderson  Date of Service: 02/13/2022     Assessment    Greg Anderson is a 76 y.o. male who presents with high risk prostate cancer (Gleason 8-10 - Poorly differentiated/undifferentiated (marked anaplasia), PSA >=10 and <20 - PSA value greater than or equal to 10 but less than 20, T1c). For locoregional imaging, his MR shows a 41 ml gland, without EPE, SVI, or LN involvement. For systemic imaging, he has had a bone scan that was negative. He presents in consultation for radiotherapy.    Today we discussed the rationale, risks, benefits, and logistics of radiation therapy.  In particular, we discussed treatment options for patients with high risk prostate cancer which can include radical prostatectomy, definitive radiotherapy, or active surveillance. In his case, I do not recommend that he pursue active surveillance.     Regarding radiotherapy treatment, I discussed two potential options for radiotherapy with either external beam radiation therapy (EBRT) alone or EBRT with a brachy therapy boost. For EBRT alone, I would anticipate treating the regional lymph nodes to a total dose of 5040 cGy in 28 fractions, with a simultaneous integrated boost to a total dose of 7000 cGy to the prostate and gross disease. We discussed that pelvic radiotherapy followed by a brachytherapy boost was shown to improve rates of biochemical recurrence free survival compared to dose escalated radiotherapy alone although there were no observed benefits in terms of overall survival or distant metastatic disease free survival.     The risks of radiation were discussed with the patient in detail.  These include, but are not limited to, common side effects such as urinary frequency, dysuria, and urgency, as well as rectal urgency, as well as lower risks of erectile dysfunction (approximately 25-35%), hematuria or stricture (2-3%), and rectal bleeding (5%).  There is less than 1% risk of bowel or bladder injury requiring surgical intervention, and a less than 1% risk of incontinence.  I indicated that a very low percentage of patients can experience urinary retention and require catheterization.  I also reviewed the low theoretical incidence of secondary malignancy with the patient and I reviewed salvage treatment options in the event of biochemical failure.     We also discussed the risks of ADT which can include limited to hot flashes, osteoporosis, osteopenia, weight gain, fatigue, gynecomastia, emotional and cognitive changes, and low Greg-term risks of cardiovascular morbidity or other end-organ damage.     At the end of the consultation all of the patient's questions were answered to their satisfaction.      Plan    The patient has decided to move forward with radiation treatment.  Since he will require ADT, I will coordinate ADT and radiation after 2-3 months of neoadjuvant ADT.    ----    Chief Complaint    high risk    History of Present Illness     Greg Anderson is a 76 y.o. male     PSA history:  06/2018 - PSA 5.1,??biopsy 1 core HG PIN  09/2020 - PSA 11.8,??biopsy negative  01/2021 - PSA 19.6??  ??  ??02/02/22 - bone scan showed NED  ??  03/06/22 - PSMA PET showed no evidence of metastatic disease  ??    12/19/21 MR Prostate     EXAM:  MR PROSTATE WITHOUT AND WITH CONTRAST    TECHNIQUE:  Multiplanar multisequence MRI images were obtained of the pelvis  centered about the  prostate. Pre and post contrast images were  obtained.    CONTRAST: 6mL GADAVIST GADOBUTROL 1 MMOL/ML IV SOLN    COMPARISON: 02/03/2019    FINDINGS:  Prostate:    Region of interest # 1: New PI-RADS category 4 lesion of the right  posterolateral peripheral zone at the apex with focally reduced T2  signal (image 45, series 10), restriction of diffusion (image 16,  series 9), and focal early enhancement (image 18, series 21). This  measures 0.46 cc (1.3 by 0.7 by 0.7 cm).    Region of interest # 2: PI-RADS category 5 lesion of the right  anterior transition zone and right anterior peripheral zone in the  apex and mid gland potentially with some involvement of the anterior  stroma in the mid gland, with increasing indistinctly marginated low  T2 signal (images 40 and 44 of series 10) with restricted diffusion  (image 16, series 9) and some early enhancement in the peripheral  zone (image 15, series 21). This is greater than 1.5 cm of capsular  contact. The T2 hypointensity in this region appears substantially  expanded compared to prior, and the accentuated diffusion weighted  signal likewise appears new.    There also some small bandlike areas of reduced T2 signal in the  peripheral zone bilaterally which are likely postinflammatory.  Encapsulated nodularity in the transition zone compatible with  benign prostatic hypertrophy.    Volume: 3D volumetric analysis: Prostate volume 41.80 cc (5.4 by 4.1  by 3.7 cm).    Transcapsular spread: Not directly visualized although region of  interest # 2 has greater than 1.5 cm of capsular contact which can  increase risk of occult transcapsular spread.    Seminal vesicle involvement: Absent    Neurovascular bundle involvement: Absent    Pelvic adenopathy: Absent    Bone metastasis: Absent    Other findings: No supplemental non-categorized findings.    IMPRESSION:  1. PI-RADS category 5 lesion of the right anterior transition zone  and anterior peripheral zone, with greater than 1.5 cm of capsular  contact; PI-RADS category 4 lesion of the right posterolateral  peripheral zone. Targeting data sent to [facility].  2. Benign prostatic hypertrophy.     He had one prior negative biopsies. On the most recent ultrasound, the??prostate size was 42 mL.     Biopsy on  on 01/17/22.  The biopsy showed Gleason 4+4 prostate cancer in 1/14 cores. Also with 4+3 in 2 cores and 3+4 in one core. All ROI cores were negative.      Past Medical History    The patient  has no past medical history on file. Past Surgical History    The patient  has no past surgical history on file.     Past Radiation History    The patient denies a history of previous radiation therapy.  The patient does not have a pacemaker.  Regarding pregnancy status, the patient is male.  The patient denies a history of collagen vascular disease.    Medications    The patient currently has no medications in their medication list.    Allergies    Patient has no allergy information on record.     Social History        Family History  No family history on file.    Review of Systems (Positives are underlined)      Constitutional: Weight changes, fatigue, fevers, chills, night sweats  HEENT:  Headaches, vision or hearing changes, tinnitus, lightheadedness, dizziness, epistaxis,  hoarseness, sore throat, dysphagia  CV:  Chest pain, pressure, palpitations, dyspnea on exertion, orthopnea, claudication, edema  Resp: Cough, wheezing, hemoptysis   GI:  Nausea, vomiting, diarrhea, abdominal pain, jaundice, constipation, rectal bleeding, melena  GU:  Dysuria, urinary frequency, urgency, hematuria, incontinence  MSK:  Muscle weakness or pain, decreased range of motion  Neuro:  Focal weakness, numbness, tingling, tremors, seizures, memory problems  Endo:  Heat or cold intolerance, polyuria, polydipsia, tremor  Skin:  New skin lesions, rashes  Heme: Easy bruising, easy bleeding, lymphadenopathy  Psychiatric: Anxiety, depression    Physical Examination    General:  The patient is sitting comfortably in no acute distress.  HEENT:  Normocephalic, atraumatic  Lymph:  No palpable lymphadenopathy   CV:  Regular rate and rhythm  Resp:  Good inspiratory effort  Abd:  Soft, non-distended  Back:  No bony tenderness  Ext:  No clubbing, cyanosis, or edema  Neuro:  CN II-XII grossly intact and symmetric, no focal neurologic deficits  Skin:  No skin lesions were noted      ECOG Performance Status: 0 = Fully active, able to carry on all pre-disease performance without restriction    Imaging and Pathology: As documented in the HPI.    Diagnosis    C61. Malignant neoplasm of prostate     N. Alonna Buckler, MD, PhD  Assistant Professor  Department of Radiation Oncology  [facility]  [id number]  [place], [place] [address]  O: [phone]  02/13/22 9:12 AM

## 2022-02-16 DIAGNOSIS — Z79818 Long term (current) use of other agents affecting estrogen receptors and estrogen levels: Principal | ICD-10-CM

## 2022-02-16 DIAGNOSIS — Z79899 Other long term (current) drug therapy: Principal | ICD-10-CM

## 2022-02-16 DIAGNOSIS — C61 Malignant neoplasm of prostate: Principal | ICD-10-CM

## 2022-02-20 NOTE — Unmapped (Signed)
[facility]   Patient Onboarding/Medication Counseling    Greg Anderson is a 76 y.o. male with prostate cancer who I am counseling today on initiation of therapy.  I am speaking to the patient.    Was a Nurse, learning disability used for this call? No    Verified patient's date of birth / HIPAA.    Specialty medication(s) to be sent: Hematology/Oncology: Orgovyx    Non-specialty medications/supplies to be sent: none    Medications not needed at this time: none     Orgovyx (relugolix)    Medication & Administration     Dosage: Take 1 tablet (120 mg total) daily    Administration:   ??? May be administered with or without food.  ??? Take at the same time every day.  ??? Swallow tablets whole. Do not crush or chew tablets.    Adherence/Missed dose instructions:   ??? Take a missed dose as soon as you think about.  ??? If it is less than 12 hours until the next dose, skip the missed dose and go back to your normal time.   ??? Do not take 2 doses at the same time or extra doses.   ??? If treatment is stopped for more than 7 days loading dose may need to be repeated (360 mg = 3 tablets)    Goals of Therapy     ??? To prevent cancer progression    Side Effects & Monitoring Parameters     Commonly reported side effects  ??? Fatigue or loss of strength/energy (26%)  ??? Hot flashes (54%)  ??? Muscle and joint pain (30%)   ??? Possible increase in Triglycerides (35%)  ??? May increase blood sugar levels (44%)  ??? Constipation or Diarrhea (12%)  ??? Trouble sleeping (less than 10%)  ??? Weight gain (less than 10%)  ??? Sexual dysfunction (less than 10%)  ??? Enlarged, tender or sore breasts (less than 10%)  ??? Testicle changes (less than 10%)    The following side effects should be reported to the provider:  ??? High blood sugar (confusion, feeling sleepy, increased thirst/hunger/urination, flushing, fast breathing, or fruity breath) (44%)  ??? Problems with liver (yellowing of the skin or eyes, darkening of urine, or abdominal pain) (18-27%)  ??? Chest pain or pressure, a fast heartbeat, or an abnormal heartbeat. (3%)  ??? Any abnormal heartbeat or signs of fluid and electrolyte problems (mood changes, confusion, muscle pain or weakness, abnormal/fast heartbeat, severe dizziness, passing out)  ??? Signs or symptoms suggestive of a stroke (change in strength on one side being greater than the other, trouble speaking/thinking, change in balance, or vision changes)  ??? Behavior/mood changes (acting aggressive, crying, depression, emotional ups and downs, restlessness, and feeling angry or irritable)  ??? Signs of anaphylaxis (wheezing, chest tightness, swelling of face, lips, tongue or throat)    Monitoring Parameters:  ??? Baseline PSA and testosterone levels and as clinically appropriate  ??? EKG and electrolytes as clinically appropriate  ??? Blood glucose and serum triglycerides periodically during therapy  ??? Hemoglobin and liver functions tests periodically during therapy  ??? Monitor adherence    Contraindications, Warnings, & Precautions     ??? QT prolongation: Androgen deprivation therapy (achieved by relugolix) may prolong QT/QTc interval.   ??? Cardiovascular risk: Androgen deprivation therapy may increase cardiovascular risk, especially in those with history of cardiovascular events. While relugolix is associated with 54% less risk of major adverse cardiovascular events compared to leuprolide, the risk still exists [employer]  et al. [place] Dessert Study. NEJM 2020).  ??? Insulin resistance: Androgen deprivation therapy may contribute to insulin resistance and diabetes.  ??? Reproductive Considerations: Relugolix may impact male fertility.   o Males with male partners of reproductive potential should use effective contraception during therapy and for 2 weeks after the last relugolix dose.    Drug/Food Interactions     ??? Medication list reviewed in Epic. The patient was instructed to inform the care team before taking any new medications or supplements. No drug interactions identified.     Storage, Handling Precautions, & Disposal     ??? Store at room temperature in the original container (do not use a pillbox or store with other medications). Do not take out of the anti-moisture cube or packet.   ??? Store in a dry place.  Do not store in a bathroom.    ??? Caregivers helping administer medication should wear gloves and wash hands immediately after. Caregivers who are pregnant or breastfeeding should not prepare doses.   ??? Keep the lid tightly closed. Keep out of the reach of children and pets.  ??? Do not flush down a toilet or pour down a drain unless instructed to do so.  Check with your local police department or fire station about drug take-back programs in your area    Current Medications (including OTC/herbals), Comorbidities and Allergies     Current Outpatient Medications   Medication Sig Dispense Refill   ??? acetaminophen (TYLENOL) 500 MG tablet Take 0.5 tablets (250 mg total) by mouth.     ??? [START ON 03/06/2022] cholecalciferol, vitamin D3-125 mcg, 5,000 unit,, 1,000 unit (25 mcg) tablet Take 1 tablet (25 mcg total) by mouth daily. 90 tablet 7   ??? cyanocobalamin (VITAMIN B-12) 500 MCG tablet Take 1 tablet (500 mcg total) by mouth daily.     ??? relugolix (ORGOVYX) 120 mg tablet Take 3 tablets (360 mg total) by mouth daily for 1 day, THEN 1 tablet (120 mg total) daily. Take with or without food. Swallow tablets whole. Do not crush or chew tablet. 30 tablet 0   ??? relugolix (ORGOVYX) 120 mg tablet Take 1 tablet (120 mg total) by mouth daily. Take with or without food. Swallow tablets whole. Do not crush or chew tablet. 30 tablet 11   ??? SUMAtriptan (IMITREX) 100 MG tablet TAKE ONE TABLET BY MOUTH AT ONSET OF MIGRAINE MAY TAKE SECOND DOSE AFTER 2 HOURS IF NEEDED     ??? tadalafiL 20 MG tablet Take 1 tablet (20 mg total) by mouth daily as needed.       No current facility-administered medications for this visit.       Allergies   Allergen Reactions   ??? Aspirin      Other reaction(s): Abdominal Pain  REACTION: upsets stomach  REACTION: upsets stomach         Patient Active Problem List   Diagnosis   ??? Malignant neoplasm of prostate (CMS-HCC)       Reviewed and up to date in Epic.    Appropriateness of Therapy     Acute infections noted within Epic:  No active infections  Patient reported infection: None    Is medication and dose appropriate based on diagnosis and infection status? Yes    Prescription has been clinically reviewed: Yes    Baseline Quality of Life Assessment      How many days over the past month did your prostate cancer  keep you from your normal activities? For example,  brushing your teeth or getting up in the morning. 0    Financial Information     Medication Assistance provided: Prior Authorization    Anticipated copay of $840.45 / 28 days reviewed with patient. Verified delivery address.    Delivery Information     Scheduled delivery date: 02/21/22    Expected start date: ASAP    Medication will be delivered via UPS to the prescription address in [facility].  This shipment will not require a signature.      Explained the services we provide at [facility] and that each month we would call to set up refills.  Stressed importance of returning phone calls so that we could ensure they receive their medications in time each month.  Informed patient that we should be setting up refills 7-10 days prior to when they will run out of medication.  A pharmacist will reach out to perform a clinical assessment periodically.  Informed patient that a welcome packet, containing information about our pharmacy and other support services, a Notice of Privacy Practices, and a drug information handout will be sent.      The patient or caregiver noted above participated in the development of this care plan and knows that they can request review of or adjustments to the care plan at any time.      Patient or caregiver verbalized understanding of the above information as well as how to contact the pharmacy at [phone] option 4 with any questions/concerns.  The pharmacy is open Monday through Friday 8:30am-4:30pm.  A pharmacist is available 24/7 via pager to answer any clinical questions they may have.    Patient Specific Needs     - Does the patient have any physical, cognitive, or cultural barriers? No    - Does the patient have adequate living arrangements? (i.e. the ability to store and take their medication appropriately) Yes    - Did you identify any home environmental safety or security hazards? No    - Patient prefers to have medications discussed with  Patient     - Is the patient or caregiver able to read and understand education materials at a high school level or above? Yes    - Patient's primary language is  English     - Is the patient high risk? No    SOCIAL DETERMINANTS OF HEALTH     At the [facility], we have learned that life circumstances - like trouble affording food, housing, utilities, or transportation can affect the health of many of our patients.   That is why we wanted to ask: are you currently experiencing any life circumstances that are negatively impacting your health and/or quality of life? No    Social Determinants of Health     Food Insecurity: Not on file   Tobacco Use: Not on file   Transportation Needs: Not on file   Alcohol Use: Not on file   Housing/Utilities: Not on file   Substance Use: Not on file   Financial Resource Strain: Not on file   Physical Activity: Not on file   Health Literacy: Not on file   Stress: Not on file   Intimate Partner Violence: Not on file   Depression: Not on file   Social Connections: Not on file       Would you be willing to receive help with any of the needs that you have identified today? Not applicable       Greg Anderson A  [facility] Specialty Pharmacist

## 2022-02-20 NOTE — Unmapped (Signed)
[facility] Specialty Medication Onboarding    Specialty Medication: Orgovyx 120mg   Prior Authorization: Approved   Financial Assistance: No - patient declined financial assistance per MAPs referral  Final Copay/Day Supply: $840.45 / 28    Insurance Restrictions: None     Notes to Pharmacist: Per CB patient has approved one time fill and would like [place] to continue looking for assistance for next fill.    The triage team has completed the benefits investigation and has determined that the patient is able to fill this medication at [facility]. Please contact the patient to complete the onboarding or follow up with the prescribing physician as needed.

## 2022-02-23 ENCOUNTER — Ambulatory Visit: Admit: 2022-02-23 | Discharge: 2022-02-24 | Payer: PRIVATE HEALTH INSURANCE

## 2022-02-23 DIAGNOSIS — C61 Malignant neoplasm of prostate: Principal | ICD-10-CM

## 2022-02-23 LAB — COMPREHENSIVE METABOLIC PANEL
ALBUMIN: 4 g/dL (ref 3.4–5.0)
ALKALINE PHOSPHATASE: 64 U/L (ref 46–116)
ALT (SGPT): 9 U/L — ABNORMAL LOW (ref 10–49)
ANION GAP: 4 mmol/L — ABNORMAL LOW (ref 5–14)
AST (SGOT): 19 U/L (ref <=34)
BILIRUBIN TOTAL: 0.6 mg/dL (ref 0.3–1.2)
BLOOD UREA NITROGEN: 19 mg/dL (ref 9–23)
BUN / CREAT RATIO: 17
CALCIUM: 9.7 mg/dL (ref 8.7–10.4)
CHLORIDE: 106 mmol/L (ref 98–107)
CO2: 31.6 mmol/L — ABNORMAL HIGH (ref 20.0–31.0)
CREATININE: 1.15 mg/dL — ABNORMAL HIGH
EGFR CKD-EPI (2021) MALE: 66 mL/min/{1.73_m2} (ref >=60)
GLUCOSE RANDOM: 102 mg/dL (ref 70–179)
POTASSIUM: 4.3 mmol/L (ref 3.4–4.8)
PROTEIN TOTAL: 6.8 g/dL (ref 5.7–8.2)
SODIUM: 142 mmol/L (ref 135–145)

## 2022-02-23 LAB — LIPID PANEL
CHOLESTEROL/HDL RATIO SCREEN: 2.5 (ref 1.0–4.5)
CHOLESTEROL: 195 mg/dL (ref <=200)
HDL CHOLESTEROL: 79 mg/dL — ABNORMAL HIGH (ref 40–60)
LDL CHOLESTEROL CALCULATED: 101 mg/dL — ABNORMAL HIGH (ref 40–99)
NON-HDL CHOLESTEROL: 116 mg/dL (ref 70–130)
TRIGLYCERIDES: 73 mg/dL (ref 0–150)
VLDL CHOLESTEROL CAL: 14.6 mg/dL (ref 12–42)

## 2022-02-23 LAB — CBC
HEMATOCRIT: 37.8 % — ABNORMAL LOW (ref 39.0–48.0)
HEMOGLOBIN: 13 g/dL (ref 12.9–16.5)
MEAN CORPUSCULAR HEMOGLOBIN CONC: 34.5 g/dL (ref 32.0–36.0)
MEAN CORPUSCULAR HEMOGLOBIN: 33.5 pg — ABNORMAL HIGH (ref 25.9–32.4)
MEAN CORPUSCULAR VOLUME: 97.2 fL — ABNORMAL HIGH (ref 77.6–95.7)
MEAN PLATELET VOLUME: 7.5 fL (ref 6.8–10.7)
PLATELET COUNT: 201 10*9/L (ref 150–450)
RED BLOOD CELL COUNT: 3.89 10*12/L — ABNORMAL LOW (ref 4.26–5.60)
RED CELL DISTRIBUTION WIDTH: 13.2 % (ref 12.2–15.2)
WBC ADJUSTED: 5.9 10*9/L (ref 3.6–11.2)

## 2022-02-23 LAB — HEMOGLOBIN A1C
ESTIMATED AVERAGE GLUCOSE: 108 mg/dL
HEMOGLOBIN A1C: 5.4 % (ref 4.8–5.6)

## 2022-02-28 LAB — VITAMIN D 25 HYDROXY: VITAMIN D, TOTAL (25OH): 15.9 ng/mL — ABNORMAL LOW (ref 20.0–80.0)

## 2022-03-02 NOTE — Unmapped (Signed)
Celso Amy 's Orgovyx shipment will be canceled  as a result of UPS returned package      I have reached out to the patient  at [phone] and left a voicemail message.  We will not reschedule the medication and have removed this/these medication(s) from the work request.  We have canceled this work request.

## 2022-03-06 ENCOUNTER — Ambulatory Visit: Admit: 2022-03-06 | Payer: PRIVATE HEALTH INSURANCE

## 2022-03-06 ENCOUNTER — Ambulatory Visit: Admit: 2022-03-06 | Discharge: 2022-03-06 | Payer: PRIVATE HEALTH INSURANCE

## 2022-03-06 ENCOUNTER — Encounter: Admit: 2022-03-06 | Payer: PRIVATE HEALTH INSURANCE | Attending: Radiation Oncology | Primary: Radiation Oncology

## 2022-03-06 ENCOUNTER — Encounter
Admit: 2022-03-06 | Discharge: 2022-04-02 | Payer: PRIVATE HEALTH INSURANCE | Attending: Radiation Oncology | Primary: Radiation Oncology

## 2022-03-06 ENCOUNTER — Ambulatory Visit: Admit: 2022-03-06 | Discharge: 2022-03-16 | Payer: PRIVATE HEALTH INSURANCE

## 2022-03-06 MED ORDER — CHOLECALCIFEROL (VITAMIN D3) 25 MCG (1,000 UNIT) TABLET
ORAL_TABLET | Freq: Every day | ORAL | 7 refills | 90 days | Status: CP
Start: 2022-03-06 — End: 2024-02-24

## 2022-03-06 MED ADMIN — F-18 Piflufolastat (Pylarify): 9.42 | INTRAVENOUS | @ 19:00:00 | Stop: 2022-03-06

## 2022-03-07 ENCOUNTER — Institutional Professional Consult (permissible substitution): Admit: 2022-03-07 | Discharge: 2022-03-08 | Payer: PRIVATE HEALTH INSURANCE

## 2022-03-07 DIAGNOSIS — C61 Malignant neoplasm of prostate: Principal | ICD-10-CM

## 2022-03-07 MED ORDER — BICALUTAMIDE 50 MG TABLET
ORAL_TABLET | Freq: Every day | ORAL | 0 refills | 30 days | Status: CP
Start: 2022-03-07 — End: 2022-04-06

## 2022-03-07 MED ADMIN — leuprolide (3 month) (ELIGARD) injection 22.5 mg: 22.5 mg | SUBCUTANEOUS | @ 20:00:00 | Stop: 2022-03-07

## 2022-03-07 NOTE — Unmapped (Signed)
Here to consult with dr Carles Collet for the role of radiation in treating his prostate cancer. I spent 8 mins educating pt and wife on radiation treatment process and planning. They were given contact information and questions were answered.

## 2022-03-07 NOTE — Unmapped (Signed)
Radiation Oncology Initial Visit Note    Patient Name: Greg Anderson  Patient Age: 76 y.o.  Encounter Date: 03/07/2022    Referring Physician:   No referring provider defined for this encounter.    Assessment:  Greg Anderson is a 76 y.o. male with high risk prostate cancer, cT1c, Gleason 4+4 = 8 in 3/14 cores, PSA 19.6.    Recommendations:  We reviewed the natural history of high risk prostate cancer and the therapeutic options available including radiation therapy and surgery. The patient also had a discussion regarding treatment with Dr. Angela Adam (Urology). Both radiation and surgery serve as first-line options. However, for a patient with high risk prostate cancer pursuing surgery, there is a possibility that he may have positive surgical margins or pT3 disease, and therefore have indications for adjuvant radiotherapy.     The radiation treatment recommendations are based on the results of multiple randomized trials. For high-risk prostate cancer, two large randomized trials (SPCG-7, Intergroup T94/0110) have demonstrated that radiation treatment improves overall survival compared to conservative treatment with androgen deprivation therapy (ADT) alone. These are the only randomized trials that have shown improved survival from definitive treatment in high-risk disease. For radiotherapy, we recommend 5-6 weeks of intensity-modulated radiation therapy (IMRT), along with long-term ADT. These specific recommendations are based on randomized trials in high-risk prostate cancer which have demonstrated that adding ADT to radiation improves overall survival (TROG 96.01, EORTC 96045, RTOG 85-31), long-term ADT is better than short-term ADT (RTOG 92-02, EORTC 40981).     With regard to IMRT, we discussed the logistics including a planning session followed by 28 treatments over 5 weeks, Monday-Friday, five days a week.     Potential side effects of radiation treatment can include urinary symptoms (increased frequency and urgency, dysuria, and obstructive symptoms), GI symptoms (loose stools and blood in stool), and sexual dysfunction. However, urinary incontinence is relatively uncommon after radiation treatment.    We also discussed potential side effects from androgen deprivation therapy including fatigue, hot flashes, and decreased sexual interest.    After a discussion, patient would like to proceed with radiation treatment. We will initiate ADT today with a Lupron shot (22.5 mg), and a prescription for a 32-month supply of Casodex was given. We will schedule CT simulation in our department to initiate the radiation planning process.    -------------------------    Reason for Consultation:  Greg Anderson is a 76 y.o. male who is seen in consultation at the request of No referring provider defined for this encounter. for an opinion regarding the treatment of his prostate cancer.    History of Present Illness:    Oncology History   Malignant neoplasm of prostate (CMS-HCC)   02/16/2022 Initial Diagnosis    Malignant neoplasm of prostate (CMS-HCC)     03/06/2022 Endocrine/Hormone Therapy    OP PROSTATE LEUPROLIDE (ELIGARD) 45 MG EVERY 6 MONTHS  Plan Provider: Frann Rider, MD     03/07/2022 Endocrine/Hormone Therapy    OP PROSTATE LEUPROLIDE (ELIGARD) 22.5 MG EVERY 3 MONTHS  Plan Provider: Peterson Lombard, MD       PSA history:  06/2018 - PSA 5.1, biopsy 1 core HG PIN  09/2020 - PSA 11.8, biopsy negative  01/2021 - PSA 19.6     Imaging history    12/2021 - MRI PIRADS 5 and 4 lesions on the right. No evidence of ECE    02/02/22 - bone scan showed NED    03/06/22 - PSMA PET showed  no evidence of metastatic disease    Prior biopsy history    01/17/22 - Gleason 4+4 = 8 in 3/12 systemic cores, Gleason 3+4 = 7 in 1/12 systemic cores, Gleason 3+3 = 6 in 1/12 systemic cores, NED in 2 targeted cores    Baseline Symptoms:  General: Good general health  Urinary: mild frequency, nocturia x1, no urgency or incontinence  GI: Denies loose stools, rectal pain, or bleeding.   Inflammatory bowel disease: no    Prior Radiation Therapy: no  Pacemaker: no  Pregnancy status: No; male patient    Review of Systems:  A 10 systems was negative except for pertinent positives noted in HPI.    Past Medical History:  No past medical history on file.    Family History:  No family history on file.    Social History:   Social History     Socioeconomic History   ??? Marital status: Married     Allergies:  Allergies   Allergen Reactions   ??? Aspirin      Other reaction(s): Abdominal Pain  REACTION: upsets stomach  REACTION: upsets stomach       Current Medications:  Current Outpatient Medications   Medication Sig Dispense Refill   ??? acetaminophen (TYLENOL) 500 MG tablet Take 0.5 tablets (250 mg total) by mouth.     ??? cholecalciferol, vitamin D3-125 mcg, 5,000 unit,, 1,000 unit (25 mcg) tablet Take 1 tablet (25 mcg total) by mouth daily. 90 tablet 7   ??? cyanocobalamin (VITAMIN B-12) 500 MCG tablet Take 1 tablet (500 mcg total) by mouth daily.     ??? relugolix (ORGOVYX) 120 mg tablet Take 3 tablets (360 mg total) by mouth daily for 1 day, THEN 1 tablet (120 mg total) daily. Take with or without food. Swallow tablets whole. Do not crush or chew tablet. 30 tablet 0   ??? relugolix (ORGOVYX) 120 mg tablet Take 1 tablet (120 mg total) by mouth daily. Take with or without food. Swallow tablets whole. Do not crush or chew tablet. 30 tablet 11   ??? SUMAtriptan (IMITREX) 100 MG tablet TAKE ONE TABLET BY MOUTH AT ONSET OF MIGRAINE MAY TAKE SECOND DOSE AFTER 2 HOURS IF NEEDED     ??? tadalafiL 20 MG tablet Take 1 tablet (20 mg total) by mouth daily as needed.       No current facility-administered medications for this visit.     Facility-Administered Medications Ordered in Other Visits   Medication Dose Route Frequency Provider Last Rate Last Admin   ??? leuprolide (3 month) (ELIGARD) injection 22.5 mg  22.5 mg Subcutaneous Once Peterson Lombard, MD         Physical Exam:  There were no vitals taken for this visit.   Karnofsky/Lansky Performance Status:  100, Fully active, able to carry on all pre-disease performed without restriction (ECOG equivalent 0)  General: Well-developed, no acute distress  HEENT:Normocephalic, moist mucous membranes  CV: Warm, well perfused  Respiratory: Breathing comfortably on room air  GI: Non-distended  MSK: Good muscle tone  Neuro: Alert and oriented to conversation  Psych: Appropriate insight and affect  GU: deferred to avoid multiple exams. Please refer to the urology team's note.     Labs:  Reviewed in [facility]    Radiology:  Radiology reviewed and as outlined above in the HPI     Pathology:  Pathology report reviewed and as outlined above in the HPI    Old/outside medical records:  Personally reviewed and as noted  in the HPI.    The above case was reviewed, patient was seen and examined by, and the plan of care was discussed with Dr. Carles Collet    Electronically signed by:  Duard Larsen, MD  Radiation Oncology Resident, PGY-5  [facility]  Pager: [phone]    I saw and evaluated/examined the patient, participating in the key portions of the service. I discussed the findings, assessment, and plan of care with the resident. I agree with the findings and plan as documented in the resident's note.    75yo with high risk prostate cancer (cT1c, Gleason 4+4, 6/14 cores involved, PSA 19.6).  No evidence of regional or distant disease on PSMA PET, although incidental finding of non-avid liver hypodensity noted that we can follow up on.  He has already met with rad onc in [facility] (Dr Alphonse Guild) but prefers treatment in [place].  We will start ADT today (Eligard with casodex) with plans for 18 months of therapy and will coordinate CT sim.  DEXA does show decreased bone density, discussed Ca/Vit D supplementation for now.    Rayetta Humphrey, MD  Assistant Professor  [facility] Dept of Radiation Oncology  03/10/2022

## 2022-03-07 NOTE — Unmapped (Signed)
Pt received eligard 22.5mg  injection in left lower abdomen - pt tolerated well.    AVS received at scheduling.    Pt stable and ambulated independently from clinic.

## 2022-03-09 MED ORDER — BICALUTAMIDE 50 MG TABLET
ORAL_TABLET | Freq: Every day | ORAL | 0 refills | 30 days | Status: CP
Start: 2022-03-09 — End: 2022-04-08

## 2022-03-13 ENCOUNTER — Telehealth: Payer: Self-pay | Admitting: Family Medicine

## 2022-03-13 NOTE — Telephone Encounter (Signed)
Copied from [place] [id number]. Topic: Medicare AWV ?>> Mar 13, 2022 10:26 AM Cher Nakai R wrote: ?Reason for CRM:  ?Left message for patient to call back and schedule Medicare Annual Wellness Visit (AWV) in office.  ? ?If unable to come into the office for AWV,  please offer to do virtually or by telephone. ? ?Last AWV: 02/03/2021 ? ?Please schedule at anytime with [place]. ? ?30 minute appointment for Virtual or phone ?45 minute appointment for in office or Initial virtual/phone ? ?Any questions, please contact me at [phone] ?

## 2022-03-13 NOTE — Unmapped (Signed)
Specialty Medication(s): Orgovyx    Greg Anderson has been dis-enrolled from the [facility] specialty pharmacy services due to a change in therapy. The patient is now taking Lupron injections and is not filling at the [facility].    Additional information provided to the patient: NA    [name] A [facility] Specialty Pharmacist

## 2022-03-15 DIAGNOSIS — C61 Malignant neoplasm of prostate: Principal | ICD-10-CM

## 2022-04-03 ENCOUNTER — Encounter: Admit: 2022-04-03 | Payer: PRIVATE HEALTH INSURANCE | Attending: Radiation Oncology | Primary: Radiation Oncology

## 2022-04-03 ENCOUNTER — Ambulatory Visit: Admit: 2022-04-03 | Payer: PRIVATE HEALTH INSURANCE

## 2022-04-03 ENCOUNTER — Encounter
Admit: 2022-04-03 | Discharge: 2022-05-03 | Payer: PRIVATE HEALTH INSURANCE | Attending: Radiation Oncology | Primary: Radiation Oncology

## 2022-04-05 MED ORDER — TAMSULOSIN 0.4 MG CAPSULE
ORAL_CAPSULE | Freq: Every day | ORAL | 2 refills | 30 days | Status: CP
Start: 2022-04-05 — End: 2022-07-04

## 2022-05-03 DIAGNOSIS — C61 Malignant neoplasm of prostate: Principal | ICD-10-CM

## 2022-05-04 ENCOUNTER — Ambulatory Visit: Admit: 2022-05-04 | Payer: PRIVATE HEALTH INSURANCE

## 2022-06-05 ENCOUNTER — Ambulatory Visit: Admit: 2022-06-05 | Payer: PRIVATE HEALTH INSURANCE

## 2022-06-05 ENCOUNTER — Institutional Professional Consult (permissible substitution): Admit: 2022-06-05 | Discharge: 2022-06-06 | Payer: PRIVATE HEALTH INSURANCE

## 2022-06-05 DIAGNOSIS — C61 Malignant neoplasm of prostate: Principal | ICD-10-CM

## 2022-08-18 DIAGNOSIS — D0339 Melanoma in situ of other parts of face: Principal | ICD-10-CM

## 2022-09-03 DIAGNOSIS — C61 Malignant neoplasm of prostate: Principal | ICD-10-CM

## 2022-09-07 ENCOUNTER — Ambulatory Visit: Admit: 2022-09-07 | Payer: PRIVATE HEALTH INSURANCE | Attending: Radiation Oncology | Primary: Radiation Oncology

## 2022-09-07 ENCOUNTER — Institutional Professional Consult (permissible substitution): Admit: 2022-09-07 | Discharge: 2022-09-08 | Payer: PRIVATE HEALTH INSURANCE

## 2022-09-07 DIAGNOSIS — C61 Malignant neoplasm of prostate: Principal | ICD-10-CM

## 2022-10-02 ENCOUNTER — Ambulatory Visit: Admit: 2022-10-02 | Discharge: 2022-10-03 | Payer: PRIVATE HEALTH INSURANCE

## 2022-11-24 ENCOUNTER — Ambulatory Visit
Admission: EM | Admit: 2022-11-24 | Discharge: 2022-11-24 | Disposition: A | Discharge status: Home / Self Care | Payer: PPO

## 2022-11-24 DIAGNOSIS — R051 Acute cough: Secondary | ICD-10-CM

## 2022-11-24 DIAGNOSIS — R6889 Other general symptoms and signs: Secondary | ICD-10-CM

## 2022-11-24 DIAGNOSIS — J101 Influenza due to other identified influenza virus with other respiratory manifestations: Secondary | ICD-10-CM | POA: Diagnosis not present

## 2022-11-24 LAB — POCT INFLUENZA A/B
Influenza A, POC: POSITIVE — AB
Influenza B, POC: NEGATIVE

## 2022-11-24 MED ORDER — BENZONATATE 100 MG PO CAPS: ORAL_CAPSULE | ORAL | 0 refills | Status: AC

## 2022-11-24 MED ORDER — OSELTAMIVIR PHOSPHATE 75 MG PO CAPS: 75.0000 mg | ORAL_CAPSULE | Freq: Two times a day (BID) | ORAL | 0 refills | Status: AC

## 2022-11-24 NOTE — ED Provider Notes (Addendum)
Mike Spence    CSN: [id number] Arrival date & time: 11/24/22  0820      History   Chief Complaint Chief Complaint  Patient presents with   Nasal Congestion   Cough    HPI Mike Spence is a 76 y.o. male.    Cough   Presents to urgent care with complaint of congestion and productive cough.  He endorses coughing up "blood streaked sputum".    Presents with elevated heart rate of 113 bpm and mildly elevated temperature of 99.4 F.  He states he swam laps on Wednesday and felt great, but then started to feel "real bad" and Wednesday evening and stayed in bed all day yesterday.  Endorses body aches.  Denies chills.   Past Medical History:  Diagnosis Date   Anxiety    Barrett's esophagus    Colon adenomas    Diaphragmatic hernia    Dysrhythmia    GERD (gastroesophageal reflux disease)    Herniated nucleus pulposus of lumbosacral region    Hypertension    Migraines    Palpitations    Hx of them- identified as PVC's. did a holter monitor in 2/10 showing frequent PVC's. Additionally, the patient had an echcardiogram done in 2/10. EF 55%. there was some abnormal septal motion that may have been due to frequent PVC's. there was no significant valvular disorder and the RV was normal in size and function.   Peripheral neuropathy    Varicose vein of leg     Patient Active Problem List   Diagnosis Date Noted   Benign prostatic hyperplasia with nocturia 07/23/2020   High grade prostatic intraepithelial neoplasia 07/01/2018   Elevated PSA 05/08/2018   History of Barrett's esophagus 09/28/2017   Phlebectasia 09/20/2015   Acid reflux 09/20/2015   Anxiety, generalized 09/15/2015   Personal history of tobacco use, presenting hazards to health 09/15/2015   Anterior optic neuritis 09/15/2015   HYPERTENSION, BENIGN 10/26/2010   PALPITATIONS 10/26/2010   Barrett esophagus 06/16/2008   Leg varices 06/04/2007   Diaphragmatic hernia 06/01/2006   Esophagitis, reflux  06/01/2006   Essential (primary) hypertension 06/01/2006    Past Surgical History:  Procedure Laterality Date   COLONOSCOPY WITH PROPOFOL N/A 07/06/2016   Procedure: COLONOSCOPY WITH PROPOFOL;  Surgeon: Manya Silvas, MD;  Location: [place];  Service: Endoscopy;  Laterality: N/A;   COLONOSCOPY WITH PROPOFOL N/A 01/22/2020   Procedure: COLONOSCOPY WITH PROPOFOL;  Surgeon: Toledo, Benay Pike, MD;  Location: [facility] ENDOSCOPY;  Service: Gastroenterology;  Laterality: N/A;   ESOPHAGOGASTRODUODENOSCOPY (EGD) WITH PROPOFOL N/A 07/06/2016   Procedure: ESOPHAGOGASTRODUODENOSCOPY (EGD) WITH PROPOFOL;  Surgeon: Manya Silvas, MD;  Location: [facility] ENDOSCOPY;  Service: Endoscopy;  Laterality: N/A;   PROSTATE BIOPSY     RETINAL DETACHMENT SURGERY  2008   TONSILLECTOMY AND ADENOIDECTOMY  1950's   VARICOSE VEIN SURGERY  2013       Home Medications    Prior to Admission medications   Medication Sig Start Date End Date Taking? Authorizing Provider  rizatriptan (MAXALT-MLT) 10 MG disintegrating tablet Take by mouth. 11/17/22 11/17/23 Yes [provider]  SUMAtriptan (IMITREX) 100 MG tablet TAKE ONE TABLET BY MOUTH AS NEEDED FOR MIGRAINE FOR UP TO ONE DOSE. MAY TAKE A SECOND DOSE AFTER TWO HOURS IF NEEDED 12/14/21  Yes [provider]  calcium citrate (CALCITRATE - DOSED IN MG ELEMENTAL CALCIUM) 950 (200 Ca) MG tablet Take 200 mg of elemental calcium by mouth daily.    [provider]  CHOLECALCIFEROL  PO Take by mouth.    [provider]  cyanocobalamin (VITAMIN B12) 500 MCG tablet Take by mouth.    [provider]  MAGNESIUM CITRATE PO Take by mouth daily at 6 (six) AM.    [provider]  Multiple Vitamin (MULTIVITAMIN) capsule Take 1 capsule by mouth daily.    [provider]  omeprazole (PRILOSEC) 20 MG capsule Take 1 capsule (20 mg total) by mouth 2 (two) times daily before a meal. 05/05/21   Chrismon, Vickki Muff, PA-C  tadalafil  (CIALIS) 20 MG tablet Take 1 tab 1 hour prior to intercourse 09/29/21   Stoioff, Ronda Fairly, MD  tamsulosin (FLOMAX) 0.4 MG CAPS capsule Take 0.4 mg by mouth daily.    [provider]    Family History Family History  Problem Relation Age of Onset   Hypertension Mother    Dementia Mother    Colon cancer Mother    Kidney disease Father    Hypertension Father    Diabetes Father    Dementia Father    Colon cancer Sister    Mitral valve prolapse Daughter    Anxiety disorder Daughter    Ulcerative colitis Son    Heart attack Maternal Grandfather    Colon cancer Paternal Grandmother    CVA Paternal Grandfather    Coronary artery disease Neg Hx     Social History Social History   Tobacco Use   Smoking status: Former    Years: 1.00    Types: Cigarettes   Smokeless tobacco: Never   Tobacco comments:    >50 years ago in college  Vaping Use   Vaping Use: Never used  Substance Use Topics   Alcohol use: Yes    Alcohol/week: 0.0 standard drinks of alcohol    Comment: glass of wine every 6 months   Drug use: No     Allergies   Aspirin   Review of Systems Review of Systems  Respiratory:  Positive for cough.      Physical Exam Triage Vital Signs ED Triage Vitals [11/24/22 0832]  Enc Vitals Group     BP 129/83     Pulse Rate (!) 113     Resp 17     Temp 99.4 F (37.4 C)     Temp src      SpO2 98 %     Weight      Height      Head Circumference      Peak Flow      Pain Score 0     Pain Loc      Pain Edu?      Excl. in [place]?    No data found.  Updated Vital Signs BP 129/83   Pulse (!) 113   Temp 99.4 F (37.4 C)   Resp 17   SpO2 98%   Visual Acuity Right Eye Distance:   Left Eye Distance:   Bilateral Distance:    Right Eye Near:   Left Eye Near:    Bilateral Near:     Physical Exam Vitals reviewed.  Constitutional:      Appearance: He is ill-appearing.  HENT:     Mouth/Throat:     Pharynx: No oropharyngeal exudate or posterior  oropharyngeal erythema.  Cardiovascular:     Rate and Rhythm: Normal rate and regular rhythm.     Pulses: Normal pulses.     Heart sounds: Normal heart sounds.  Pulmonary:     Effort: Pulmonary effort is normal.  Breath sounds: Normal breath sounds.  Skin:    General: Skin is warm and dry.  Neurological:     General: No focal deficit present.     Mental Status: He is alert and oriented to person, place, and time.  Psychiatric:        Mood and Affect: Mood normal.        Behavior: Behavior normal.      UC Treatments / Results  Labs (all labs ordered are listed, but only abnormal results are displayed) Labs Reviewed - No data to display  EKG   Radiology No results found.  Procedures Procedures (including critical care time)  Medications Ordered in UC Medications - No data to display  Initial Impression / Assessment and Plan / UC Course  I have reviewed the triage vital signs and the nursing notes.  Pertinent labs & imaging results that were available during my care of the patient were reviewed by me and considered in my medical decision making (see chart for details).   Patient is afebrile here without recent antipyretics. Satting well on room air. Overall is ill appearing, though well hydrated and without respiratory distress. Pulmonary exam is unremarkable.   Likely viral illness including influenza.  POC flu is treated for influenza A prescribing Tamiflu. Also giving benzonatate for cough and recommending use of OTC medication for control of other symptoms..  Final Clinical Impressions(s) / UC Diagnoses   Final diagnoses:  None   Discharge Instructions   None    ED Prescriptions   None    PDMP not reviewed this encounter.   Rose Phi, [place] 11/24/22 0900    ImmordinoAnnie Main, FNP 11/24/22 [phone]

## 2022-11-24 NOTE — ED Triage Notes (Signed)
Pt. Presents to UC w/ c/o congestion and a productive cough. Pt. States he has been coughing up blood streaked sputum as well.

## 2022-11-24 NOTE — Discharge Instructions (Addendum)
You have been diagnosed with a viral upper respiratory infection based on your symptoms and exam. Viral illnesses cannot be treated with antibiotics - they are self limiting - and you should find your symptoms resolving within a few days. Get plenty of rest and non-caffeinated fluids. Watch for signs of dehydration including reduced urine output and dark colored urine.  We have performed a respiratory swab checking for influenza and you are positive for influenza A. I have prescribed Tamiflu, antiviral therapy for influenza A, based on a presumptive diagnosis of influenza.  Someone will contact you after results of your swab are available with instructions to continue or stop this medication.  We recommend you use over-the-counter medications for symptom control including Tylenol or ibuprofen for fever, chills or body aches, and cold/cough medication.  I have prescribed benzonatate (Tessalon Perles) for your cough.  Saline mist spray is helpful for removing excess mucus from your nose.  Room humidifiers are helpful to ease breathing at night. I recommend guaifenesin (Mucinex) to help thin and loosen mucus secretions in your respiratory passages.  You might also find relief of nasal/sinus congestion symptoms by using a nasal decongestant such as Flonase (fluticasone) or Sudafed sinus (pseudoephedrine).  You will need to obtain Sudafed from behind the pharmacist counter.  Speak to the pharmacist to verify that you are not duplicating medications with other over-the-counter formulations that you may be using.

## 2022-11-30 ENCOUNTER — Ambulatory Visit: Admit: 2022-11-30 | Discharge: 2022-11-30 | Payer: PRIVATE HEALTH INSURANCE

## 2022-11-30 ENCOUNTER — Institutional Professional Consult (permissible substitution): Admit: 2022-11-30 | Discharge: 2022-11-30 | Payer: PRIVATE HEALTH INSURANCE

## 2022-11-30 ENCOUNTER — Ambulatory Visit
Admit: 2022-11-30 | Discharge: 2022-12-03 | Payer: PRIVATE HEALTH INSURANCE | Attending: Radiation Oncology | Primary: Radiation Oncology

## 2022-11-30 DIAGNOSIS — C61 Malignant neoplasm of prostate: Principal | ICD-10-CM

## 2022-11-30 DIAGNOSIS — Z923 Personal history of irradiation: Principal | ICD-10-CM

## 2022-11-30 DIAGNOSIS — Z79890 Hormone replacement therapy (postmenopausal): Principal | ICD-10-CM

## 2023-01-09 ENCOUNTER — Other Ambulatory Visit: Payer: Self-pay | Admitting: Family Medicine

## 2023-01-09 DIAGNOSIS — Z9189 Other specified personal risk factors, not elsewhere classified: Secondary | ICD-10-CM

## 2023-01-23 ENCOUNTER — Ambulatory Visit
Admission: RE | Admit: 2023-01-23 | Discharge: 2023-01-23 | Disposition: A | Discharge status: Home / Self Care | Payer: PPO | Source: Ambulatory Visit | Attending: Family Medicine | Admitting: Family Medicine

## 2023-01-23 DIAGNOSIS — Z9189 Other specified personal risk factors, not elsewhere classified: Secondary | ICD-10-CM

## 2023-02-11 ENCOUNTER — Ambulatory Visit
Admission: EM | Admit: 2023-02-11 | Discharge: 2023-02-11 | Disposition: A | Discharge status: Home / Self Care | Payer: PPO | Attending: Emergency Medicine | Admitting: Emergency Medicine

## 2023-02-11 DIAGNOSIS — M7918 Myalgia, other site: Secondary | ICD-10-CM

## 2023-02-11 DIAGNOSIS — I10 Essential (primary) hypertension: Secondary | ICD-10-CM

## 2023-02-11 DIAGNOSIS — M79651 Pain in right thigh: Secondary | ICD-10-CM | POA: Diagnosis not present

## 2023-02-11 MED ORDER — METHOCARBAMOL 500 MG PO TABS: 500.0000 mg | ORAL_TABLET | Freq: Two times a day (BID) | ORAL | 0 refills | Status: AC | PRN

## 2023-02-11 NOTE — Discharge Instructions (Addendum)
Take the methocarbamol as directed for muscle spasm.  Do not drive, operate machinery, drink alcohol, or perform dangerous activities while taking this medication as it may cause drowsiness. Follow up with your primary care provider or an orthopedist if your symptoms are not improving.     Your blood pressure is elevated today at 176/96; recheck 121/72.  Please have this rechecked by your primary care provider this week.

## 2023-02-11 NOTE — ED Provider Notes (Signed)
[facility]    CSN: [id number] Arrival date & time: 02/11/23  1028      History   Chief Complaint Chief Complaint  Patient presents with   Back Pain    HPI TIMBER PESICKA is a 77 y.o. male.  Patient presents with right thigh pain x 4 days.  No falls or trauma.  The pain started after he was stretching.  It is worse with weightbearing; sharp; radiates to his right hip and down his right leg.  He denies redness, bruising, warmth, fever, chills, numbness, weakness, saddle anesthesia, loss of bowel/bladder control, abdominal pain, bowel difficulty, dysuria, chest pain, shortness of breath, or other symptoms.  No OTC medications taken today.  He reports remote history of herniated disc at L4-5.  His medical history includes hypertension, prostate cancer, migraine headache, Barrett's esophagus.  The history is provided by the patient and medical records.    Past Medical History:  Diagnosis Date   Anxiety    Barrett's esophagus    Colon adenomas    Diaphragmatic hernia    Dysrhythmia    GERD (gastroesophageal reflux disease)    Herniated nucleus pulposus of lumbosacral region    Hypertension    Migraines    Palpitations    Hx of them- identified as PVC's. did a holter monitor in 2/10 showing frequent PVC's. Additionally, the patient had an echcardiogram done in 2/10. EF 55%. there was some abnormal septal motion that may have been due to frequent PVC's. there was no significant valvular disorder and the RV was normal in size and function.   Peripheral neuropathy    Varicose vein of leg     Patient Active Problem List   Diagnosis Date Noted   Benign prostatic hyperplasia with nocturia 07/23/2020   High grade prostatic intraepithelial neoplasia 07/01/2018   Elevated PSA 05/08/2018   History of Barrett's esophagus 09/28/2017   Phlebectasia 09/20/2015   Acid reflux 09/20/2015   Anxiety, generalized 09/15/2015   Personal history of tobacco use, presenting hazards to  health 09/15/2015   Anterior optic neuritis 09/15/2015   HYPERTENSION, BENIGN 10/26/2010   PALPITATIONS 10/26/2010   Barrett esophagus 06/16/2008   Leg varices 06/04/2007   Diaphragmatic hernia 06/01/2006   Esophagitis, reflux 06/01/2006   Essential (primary) hypertension 06/01/2006    Past Surgical History:  Procedure Laterality Date   COLONOSCOPY WITH PROPOFOL N/A 07/06/2016   Procedure: COLONOSCOPY WITH PROPOFOL;  Surgeon: Manya Silvas, MD;  Location: [facility];  Service: Endoscopy;  Laterality: N/A;   COLONOSCOPY WITH PROPOFOL N/A 01/22/2020   Procedure: COLONOSCOPY WITH PROPOFOL;  Surgeon: Toledo, Benay Pike, MD;  Location: [facility] ENDOSCOPY;  Service: Gastroenterology;  Laterality: N/A;   ESOPHAGOGASTRODUODENOSCOPY (EGD) WITH PROPOFOL N/A 07/06/2016   Procedure: ESOPHAGOGASTRODUODENOSCOPY (EGD) WITH PROPOFOL;  Surgeon: Manya Silvas, MD;  Location: [facility] ENDOSCOPY;  Service: Endoscopy;  Laterality: N/A;   PROSTATE BIOPSY     RETINAL DETACHMENT SURGERY  2008   TONSILLECTOMY AND ADENOIDECTOMY  1950's   VARICOSE VEIN SURGERY  2013       Home Medications    Prior to Admission medications   Medication Sig Start Date End Date Taking? Authorizing Provider  methocarbamol (ROBAXIN) 500 MG tablet Take 1 tablet (500 mg total) by mouth 2 (two) times daily as needed for muscle spasms. 02/11/23  Yes Sharion Balloon, NP  benzonatate (TESSALON) 100 MG capsule Take 1-2 tablets 3 times a day as needed for cough Patient not taking: Reported on 02/11/2023 11/24/22  Immordino, Annie Main, FNP  calcium citrate (CALCITRATE - DOSED IN MG ELEMENTAL CALCIUM) 950 (200 Ca) MG tablet Take 200 mg of elemental calcium by mouth daily.    [provider]  CHOLECALCIFEROL PO Take by mouth.    [provider]  cyanocobalamin (VITAMIN B12) 500 MCG tablet Take by mouth.    [provider]  MAGNESIUM CITRATE PO Take by mouth daily at 6 (six) AM.    [provider]  Multiple  Vitamin (MULTIVITAMIN) capsule Take 1 capsule by mouth daily.    [provider]  omeprazole (PRILOSEC) 20 MG capsule Take 1 capsule (20 mg total) by mouth 2 (two) times daily before a meal. 05/05/21   Chrismon, Vickki Muff, PA-C  oseltamivir (TAMIFLU) 75 MG capsule Take 1 capsule (75 mg total) by mouth every 12 (twelve) hours. Patient not taking: Reported on 02/11/2023 11/24/22   Immordino, Annie Main, FNP  rizatriptan (MAXALT-MLT) 10 MG disintegrating tablet Take by mouth. 11/17/22 11/17/23  [provider]  SUMAtriptan (IMITREX) 100 MG tablet TAKE ONE TABLET BY MOUTH AS NEEDED FOR MIGRAINE FOR UP TO ONE DOSE. MAY TAKE A SECOND DOSE AFTER TWO HOURS IF NEEDED 12/14/21   [provider]  tadalafil (CIALIS) 20 MG tablet Take 1 tab 1 hour prior to intercourse 09/29/21   Stoioff, Ronda Fairly, MD  tamsulosin (FLOMAX) 0.4 MG CAPS capsule Take 0.4 mg by mouth daily.    [provider]    Family History Family History  Problem Relation Age of Onset   Hypertension Mother    Dementia Mother    Colon cancer Mother    Kidney disease Father    Hypertension Father    Diabetes Father    Dementia Father    Colon cancer Sister    Mitral valve prolapse Daughter    Anxiety disorder Daughter    Ulcerative colitis Son    Heart attack Maternal Grandfather    Colon cancer Paternal Grandmother    CVA Paternal Grandfather    Coronary artery disease Neg Hx     Social History Social History   Tobacco Use   Smoking status: Former    Years: 1.00    Types: Cigarettes   Smokeless tobacco: Never   Tobacco comments:    >50 years ago in college  Vaping Use   Vaping Use: Never used  Substance Use Topics   Alcohol use: Yes    Alcohol/week: 0.0 standard drinks of alcohol    Comment: glass of wine every 6 months   Drug use: No     Allergies   Aspirin   Review of Systems Review of Systems  Constitutional:  Negative for chills and fever.  Respiratory:  Negative for cough and  shortness of breath.   Cardiovascular:  Negative for chest pain and palpitations.  Gastrointestinal:  Negative for abdominal pain, constipation and diarrhea.  Genitourinary:  Negative for dysuria, flank pain, frequency, hematuria, penile discharge and testicular pain.  Musculoskeletal:  Positive for gait problem and myalgias. Negative for arthralgias, back pain and joint swelling.  Skin:  Negative for color change, rash and wound.  Neurological:  Negative for weakness and numbness.  All other systems reviewed and are negative.    Physical Exam Triage Vital Signs ED Triage Vitals  Enc Vitals Group     BP 02/11/23 1221 (!) 176/96     Pulse --      Resp --      Temp --      Temp src --  SpO2 --      Weight 02/11/23 1219 160 lb (72.6 kg)     Height 02/11/23 1219 '5\' 11"'$  (1.803 m)     Head Circumference --      Peak Flow --      Pain Score 02/11/23 1219 8     Pain Loc --      Pain Edu? --      Excl. in [place]? --    No data found.  Updated Vital Signs BP 121/72   Pulse 96   Temp 98.9 F (37.2 C)   Resp 18   Ht '5\' 11"'$  (1.803 m)   Wt 160 lb (72.6 kg)   SpO2 98%   BMI 22.32 kg/m   Visual Acuity Right Eye Distance:   Left Eye Distance:   Bilateral Distance:    Right Eye Near:   Left Eye Near:    Bilateral Near:     Physical Exam Vitals and nursing note reviewed.  Constitutional:      General: He is not in acute distress.    Appearance: Normal appearance. He is well-developed. He is not ill-appearing.  HENT:     Mouth/Throat:     Mouth: Mucous membranes are moist.  Cardiovascular:     Rate and Rhythm: Normal rate and regular rhythm.     Heart sounds: Normal heart sounds.  Pulmonary:     Effort: Pulmonary effort is normal. No respiratory distress.     Breath sounds: Normal breath sounds.  Abdominal:     General: Bowel sounds are normal.     Palpations: Abdomen is soft.     Tenderness: There is no abdominal tenderness. There is no right CVA tenderness, left  CVA tenderness, guarding or rebound.  Musculoskeletal:        General: Tenderness present. No swelling, deformity or signs of injury. Normal range of motion.     Cervical back: Neck supple.     Comments: Generalized muscle tenderness from hip to knee.  No focal tenderness.  No erythema, warmth, ecchymosis, wounds.  Patient has large varicosities on LE.    Skin:    General: Skin is warm and dry.     Capillary Refill: Capillary refill takes less than 2 seconds.     Findings: No bruising, erythema, lesion or rash.  Neurological:     General: No focal deficit present.     Mental Status: He is alert and oriented to person, place, and time.     Sensory: No sensory deficit.     Motor: No weakness.     Gait: Gait normal.  Psychiatric:        Mood and Affect: Mood normal.        Behavior: Behavior normal.      UC Treatments / Results  Labs (all labs ordered are listed, but only abnormal results are displayed) Labs Reviewed - No data to display   EKG   Radiology No results found.  Procedures Procedures (including critical care time)  Medications Ordered in UC Medications - No data to display  Initial Impression / Assessment and Plan / UC Course  I have reviewed the triage vital signs and the nursing notes.  Pertinent labs & imaging results that were available during my care of the patient were reviewed by me and considered in my medical decision making (see chart for details).   Elevated blood pressure reading with hypertension.  Right thigh pain, musculoskeletal pain.  Treating with methocarbamol; precautions for drowsiness with this medication  discussed.  Tylenol as needed for discomfort.  Instructed patient to follow-up with his PCP or an orthopedist.  Contact information for on-call Ortho provided.  ED precautions discussed.  Education provided on musculoskeletal pain.  Also discussed with patient that his blood pressure is elevated today and needs to be rechecked by PCP this  week.  Education provided on managing hypertension.  He agrees to plan of care.    Final Clinical Impressions(s) / UC Diagnoses   Final diagnoses:  Elevated blood pressure reading in office with diagnosis of hypertension  Right thigh pain  Musculoskeletal pain     Discharge Instructions      Take the methocarbamol as directed for muscle spasm.  Do not drive, operate machinery, drink alcohol, or perform dangerous activities while taking this medication as it may cause drowsiness. Follow up with your primary care provider or an orthopedist if your symptoms are not improving.     Your blood pressure is elevated today at 176/96; recheck 121/72.  Please have this rechecked by your primary care provider this week.          ED Prescriptions     Medication Sig Dispense Auth. Provider   methocarbamol (ROBAXIN) 500 MG tablet Take 1 tablet (500 mg total) by mouth 2 (two) times daily as needed for muscle spasms. 10 tablet Sharion Balloon, NP      I have reviewed the PDMP during this encounter.   Sharion Balloon, NP 02/11/23 [phone]

## 2023-02-11 NOTE — ED Triage Notes (Signed)
Patient to Urgent Care with complaints of  right sided hip pain/ right thigh pain. Describes shooting pain down his thigh. States the pain started after he was stretching. Symptoms started on Wednesday.   Hx of the same. Diagnosed with herniated disc L4/L5.

## 2023-02-15 DIAGNOSIS — D2272 Melanocytic nevi of left lower limb, including hip: Secondary | ICD-10-CM | POA: Diagnosis not present

## 2023-02-15 DIAGNOSIS — L570 Actinic keratosis: Secondary | ICD-10-CM | POA: Diagnosis not present

## 2023-02-15 DIAGNOSIS — C44612 Basal cell carcinoma of skin of right upper limb, including shoulder: Secondary | ICD-10-CM | POA: Diagnosis not present

## 2023-02-15 DIAGNOSIS — D2262 Melanocytic nevi of left upper limb, including shoulder: Secondary | ICD-10-CM | POA: Diagnosis not present

## 2023-02-15 DIAGNOSIS — Z85828 Personal history of other malignant neoplasm of skin: Secondary | ICD-10-CM | POA: Diagnosis not present

## 2023-02-15 DIAGNOSIS — X32XXXA Exposure to sunlight, initial encounter: Secondary | ICD-10-CM | POA: Diagnosis not present

## 2023-02-15 DIAGNOSIS — D225 Melanocytic nevi of trunk: Secondary | ICD-10-CM | POA: Diagnosis not present

## 2023-02-15 DIAGNOSIS — Z85820 Personal history of malignant melanoma of skin: Secondary | ICD-10-CM | POA: Diagnosis not present

## 2023-02-15 DIAGNOSIS — D2261 Melanocytic nevi of right upper limb, including shoulder: Secondary | ICD-10-CM | POA: Diagnosis not present

## 2023-02-15 DIAGNOSIS — D485 Neoplasm of uncertain behavior of skin: Secondary | ICD-10-CM | POA: Diagnosis not present

## 2023-02-21 DIAGNOSIS — H40023 Open angle with borderline findings, high risk, bilateral: Secondary | ICD-10-CM | POA: Diagnosis not present

## 2023-02-28 DIAGNOSIS — M545 Low back pain, unspecified: Secondary | ICD-10-CM | POA: Diagnosis not present

## 2023-03-01 ENCOUNTER — Ambulatory Visit: Admit: 2023-03-01 | Discharge: 2023-03-01 | Payer: PRIVATE HEALTH INSURANCE

## 2023-03-01 ENCOUNTER — Ambulatory Visit
Admit: 2023-03-01 | Discharge: 2023-03-04 | Payer: PRIVATE HEALTH INSURANCE | Attending: Radiation Oncology | Primary: Radiation Oncology

## 2023-03-01 ENCOUNTER — Institutional Professional Consult (permissible substitution): Admit: 2023-03-01 | Discharge: 2023-03-01 | Payer: PRIVATE HEALTH INSURANCE

## 2023-03-01 DIAGNOSIS — C61 Malignant neoplasm of prostate: Principal | ICD-10-CM

## 2023-03-01 DIAGNOSIS — Z79890 Hormone replacement therapy (postmenopausal): Principal | ICD-10-CM

## 2023-03-01 DIAGNOSIS — Z923 Personal history of irradiation: Principal | ICD-10-CM

## 2023-03-22 DIAGNOSIS — M79669 Pain in unspecified lower leg: Secondary | ICD-10-CM | POA: Diagnosis not present

## 2023-03-23 DIAGNOSIS — H47293 Other optic atrophy, bilateral: Secondary | ICD-10-CM | POA: Diagnosis not present

## 2023-03-23 DIAGNOSIS — H40023 Open angle with borderline findings, high risk, bilateral: Secondary | ICD-10-CM | POA: Diagnosis not present

## 2023-03-29 DIAGNOSIS — M79669 Pain in unspecified lower leg: Secondary | ICD-10-CM | POA: Diagnosis not present

## 2023-04-03 DIAGNOSIS — C44612 Basal cell carcinoma of skin of right upper limb, including shoulder: Secondary | ICD-10-CM | POA: Diagnosis not present

## 2023-04-04 DIAGNOSIS — M79669 Pain in unspecified lower leg: Secondary | ICD-10-CM | POA: Diagnosis not present

## 2023-04-12 DIAGNOSIS — M79669 Pain in unspecified lower leg: Secondary | ICD-10-CM | POA: Diagnosis not present

## 2023-04-19 DIAGNOSIS — M79669 Pain in unspecified lower leg: Secondary | ICD-10-CM | POA: Diagnosis not present

## 2023-04-23 DIAGNOSIS — M48062 Spinal stenosis, lumbar region with neurogenic claudication: Secondary | ICD-10-CM | POA: Diagnosis not present

## 2023-04-26 DIAGNOSIS — M79669 Pain in unspecified lower leg: Secondary | ICD-10-CM | POA: Diagnosis not present

## 2023-04-28 DIAGNOSIS — M79669 Pain in unspecified lower leg: Secondary | ICD-10-CM | POA: Diagnosis not present

## 2023-05-03 DIAGNOSIS — M79669 Pain in unspecified lower leg: Secondary | ICD-10-CM | POA: Diagnosis not present

## 2023-05-08 DIAGNOSIS — M79669 Pain in unspecified lower leg: Secondary | ICD-10-CM | POA: Diagnosis not present

## 2023-05-24 DIAGNOSIS — M79669 Pain in unspecified lower leg: Secondary | ICD-10-CM | POA: Diagnosis not present

## 2023-05-29 DIAGNOSIS — C61 Malignant neoplasm of prostate: Principal | ICD-10-CM

## 2023-05-31 ENCOUNTER — Institutional Professional Consult (permissible substitution): Admit: 2023-05-31 | Discharge: 2023-06-03 | Payer: PRIVATE HEALTH INSURANCE

## 2023-05-31 ENCOUNTER — Ambulatory Visit: Admit: 2023-05-31 | Discharge: 2023-06-03 | Payer: PRIVATE HEALTH INSURANCE

## 2023-05-31 ENCOUNTER — Ambulatory Visit
Admit: 2023-05-31 | Discharge: 2023-06-03 | Payer: PRIVATE HEALTH INSURANCE | Attending: Radiation Oncology | Primary: Radiation Oncology

## 2023-05-31 DIAGNOSIS — C61 Malignant neoplasm of prostate: Principal | ICD-10-CM

## 2023-05-31 DIAGNOSIS — Z923 Personal history of irradiation: Secondary | ICD-10-CM | POA: Diagnosis not present

## 2023-06-06 DIAGNOSIS — M79669 Pain in unspecified lower leg: Secondary | ICD-10-CM | POA: Diagnosis not present

## 2023-06-20 DIAGNOSIS — Z0000 Encounter for general adult medical examination without abnormal findings: Secondary | ICD-10-CM | POA: Diagnosis not present

## 2023-06-20 DIAGNOSIS — C61 Malignant neoplasm of prostate: Secondary | ICD-10-CM | POA: Diagnosis not present

## 2023-06-21 DIAGNOSIS — R202 Paresthesia of skin: Secondary | ICD-10-CM | POA: Diagnosis not present

## 2023-06-21 DIAGNOSIS — R200 Anesthesia of skin: Secondary | ICD-10-CM | POA: Diagnosis not present

## 2023-06-22 DIAGNOSIS — M79669 Pain in unspecified lower leg: Secondary | ICD-10-CM | POA: Diagnosis not present

## 2023-06-25 ENCOUNTER — Other Ambulatory Visit: Payer: Self-pay | Admitting: Student

## 2023-06-25 DIAGNOSIS — R200 Anesthesia of skin: Secondary | ICD-10-CM

## 2023-06-30 ENCOUNTER — Ambulatory Visit
Admission: RE | Admit: 2023-06-30 | Discharge: 2023-06-30 | Disposition: A | Discharge status: Home / Self Care | Payer: PPO | Source: Ambulatory Visit | Attending: Student | Admitting: Student

## 2023-06-30 DIAGNOSIS — R200 Anesthesia of skin: Secondary | ICD-10-CM | POA: Insufficient documentation

## 2023-06-30 DIAGNOSIS — C61 Malignant neoplasm of prostate: Secondary | ICD-10-CM | POA: Diagnosis not present

## 2023-06-30 DIAGNOSIS — M5126 Other intervertebral disc displacement, lumbar region: Secondary | ICD-10-CM | POA: Diagnosis not present

## 2023-06-30 DIAGNOSIS — R202 Paresthesia of skin: Secondary | ICD-10-CM | POA: Diagnosis not present

## 2023-06-30 DIAGNOSIS — M5136 Other intervertebral disc degeneration, lumbar region: Secondary | ICD-10-CM | POA: Diagnosis not present

## 2023-08-07 DIAGNOSIS — R42 Dizziness and giddiness: Secondary | ICD-10-CM | POA: Diagnosis not present

## 2023-08-15 DIAGNOSIS — M5416 Radiculopathy, lumbar region: Secondary | ICD-10-CM | POA: Diagnosis not present

## 2023-08-15 DIAGNOSIS — G609 Hereditary and idiopathic neuropathy, unspecified: Secondary | ICD-10-CM | POA: Diagnosis not present

## 2023-08-15 DIAGNOSIS — M48061 Spinal stenosis, lumbar region without neurogenic claudication: Secondary | ICD-10-CM | POA: Diagnosis not present

## 2023-08-28 ENCOUNTER — Ambulatory Visit: Admit: 2023-08-28 | Discharge: 2023-08-29

## 2023-08-28 ENCOUNTER — Ambulatory Visit: Admit: 2023-08-28 | Payer: PRIVATE HEALTH INSURANCE | Attending: Radiation Oncology | Primary: Radiation Oncology

## 2023-08-28 DIAGNOSIS — C61 Malignant neoplasm of prostate: Principal | ICD-10-CM

## 2023-08-28 DIAGNOSIS — K649 Unspecified hemorrhoids: Secondary | ICD-10-CM | POA: Diagnosis not present

## 2023-08-28 DIAGNOSIS — R202 Paresthesia of skin: Secondary | ICD-10-CM | POA: Diagnosis not present

## 2023-08-31 DIAGNOSIS — F4323 Adjustment disorder with mixed anxiety and depressed mood: Secondary | ICD-10-CM | POA: Diagnosis not present

## 2023-09-04 DIAGNOSIS — C61 Malignant neoplasm of prostate: Principal | ICD-10-CM

## 2023-09-07 MED ORDER — PEG 3350-ELECTROLYTES 236 GRAM-22.74 GRAM-6.74 GRAM-5.86 GRAM SOLUTION
0 refills | 0 days | Status: CP
Start: 2023-09-07 — End: ?

## 2023-09-12 ENCOUNTER — Ambulatory Visit: Admit: 2023-09-12 | Discharge: 2023-09-12

## 2023-09-12 ENCOUNTER — Encounter: Admit: 2023-09-12 | Discharge: 2023-09-12 | Attending: Anesthesiology | Primary: Anesthesiology

## 2023-09-12 DIAGNOSIS — G629 Polyneuropathy, unspecified: Secondary | ICD-10-CM | POA: Diagnosis not present

## 2023-09-12 DIAGNOSIS — K219 Gastro-esophageal reflux disease without esophagitis: Secondary | ICD-10-CM | POA: Diagnosis not present

## 2023-09-12 DIAGNOSIS — Z8546 Personal history of malignant neoplasm of prostate: Secondary | ICD-10-CM | POA: Diagnosis not present

## 2023-09-12 DIAGNOSIS — Z8719 Personal history of other diseases of the digestive system: Secondary | ICD-10-CM | POA: Diagnosis not present

## 2023-09-12 DIAGNOSIS — K627 Radiation proctitis: Secondary | ICD-10-CM | POA: Diagnosis not present

## 2023-09-12 DIAGNOSIS — Z886 Allergy status to analgesic agent status: Secondary | ICD-10-CM | POA: Diagnosis not present

## 2023-09-12 DIAGNOSIS — K449 Diaphragmatic hernia without obstruction or gangrene: Secondary | ICD-10-CM | POA: Diagnosis not present

## 2023-09-12 DIAGNOSIS — D123 Benign neoplasm of transverse colon: Secondary | ICD-10-CM | POA: Diagnosis not present

## 2023-09-12 DIAGNOSIS — G43909 Migraine, unspecified, not intractable, without status migrainosus: Secondary | ICD-10-CM | POA: Diagnosis not present

## 2023-09-12 DIAGNOSIS — K635 Polyp of colon: Secondary | ICD-10-CM | POA: Diagnosis not present

## 2023-09-12 DIAGNOSIS — Z1211 Encounter for screening for malignant neoplasm of colon: Secondary | ICD-10-CM | POA: Diagnosis not present

## 2023-09-12 DIAGNOSIS — Z800 Family history of malignant neoplasm of digestive organs: Secondary | ICD-10-CM | POA: Diagnosis not present

## 2023-09-13 DIAGNOSIS — L400 Psoriasis vulgaris: Secondary | ICD-10-CM | POA: Diagnosis not present

## 2023-09-13 DIAGNOSIS — Z85820 Personal history of malignant melanoma of skin: Secondary | ICD-10-CM | POA: Diagnosis not present

## 2023-09-13 DIAGNOSIS — D2262 Melanocytic nevi of left upper limb, including shoulder: Secondary | ICD-10-CM | POA: Diagnosis not present

## 2023-09-13 DIAGNOSIS — L570 Actinic keratosis: Secondary | ICD-10-CM | POA: Diagnosis not present

## 2023-09-13 DIAGNOSIS — Z85828 Personal history of other malignant neoplasm of skin: Secondary | ICD-10-CM | POA: Diagnosis not present

## 2023-09-13 DIAGNOSIS — D2261 Melanocytic nevi of right upper limb, including shoulder: Secondary | ICD-10-CM | POA: Diagnosis not present

## 2023-09-13 DIAGNOSIS — D225 Melanocytic nevi of trunk: Secondary | ICD-10-CM | POA: Diagnosis not present

## 2023-09-14 DIAGNOSIS — Z8546 Personal history of malignant neoplasm of prostate: Secondary | ICD-10-CM | POA: Diagnosis not present

## 2023-09-14 DIAGNOSIS — F4323 Adjustment disorder with mixed anxiety and depressed mood: Secondary | ICD-10-CM | POA: Diagnosis not present

## 2023-09-14 DIAGNOSIS — D123 Benign neoplasm of transverse colon: Secondary | ICD-10-CM | POA: Diagnosis not present

## 2023-10-19 DIAGNOSIS — F4323 Adjustment disorder with mixed anxiety and depressed mood: Secondary | ICD-10-CM | POA: Diagnosis not present

## 2023-11-05 DIAGNOSIS — F4323 Adjustment disorder with mixed anxiety and depressed mood: Secondary | ICD-10-CM | POA: Diagnosis not present

## 2023-11-07 DIAGNOSIS — Z125 Encounter for screening for malignant neoplasm of prostate: Secondary | ICD-10-CM | POA: Diagnosis not present

## 2023-11-07 DIAGNOSIS — I10 Essential (primary) hypertension: Secondary | ICD-10-CM | POA: Diagnosis not present

## 2023-11-20 DIAGNOSIS — C61 Malignant neoplasm of prostate: Secondary | ICD-10-CM | POA: Diagnosis not present

## 2023-11-20 DIAGNOSIS — D649 Anemia, unspecified: Secondary | ICD-10-CM | POA: Diagnosis not present

## 2023-11-21 ENCOUNTER — Telehealth: Admit: 2023-11-21 | Payer: PRIVATE HEALTH INSURANCE | Attending: Radiation Oncology | Primary: Radiation Oncology

## 2023-11-21 DIAGNOSIS — C61 Malignant neoplasm of prostate: Principal | ICD-10-CM

## 2023-11-23 DIAGNOSIS — F4323 Adjustment disorder with mixed anxiety and depressed mood: Secondary | ICD-10-CM | POA: Diagnosis not present

## 2024-02-21 ENCOUNTER — Encounter: Admit: 2024-02-21 | Payer: PRIVATE HEALTH INSURANCE | Attending: Radiation Oncology | Primary: Radiation Oncology

## 2024-02-21 DIAGNOSIS — C61 Malignant neoplasm of prostate: Principal | ICD-10-CM
